# Patient Record
Sex: Male | Born: 1960 | Race: White | Hispanic: No | Marital: Single | Smoking: Former smoker
Health system: Southern US, Community
[De-identification: ages and names within clinical notes are randomized; demographics above are authoritative.]

## PROBLEM LIST (undated history)

## (undated) DIAGNOSIS — K649 Unspecified hemorrhoids: Secondary | ICD-10-CM

## (undated) DIAGNOSIS — F1910 Other psychoactive substance abuse, uncomplicated: Secondary | ICD-10-CM

## (undated) DIAGNOSIS — K4090 Unilateral inguinal hernia, without obstruction or gangrene, not specified as recurrent: Secondary | ICD-10-CM

## (undated) DIAGNOSIS — C801 Malignant (primary) neoplasm, unspecified: Secondary | ICD-10-CM

## (undated) DIAGNOSIS — D649 Anemia, unspecified: Secondary | ICD-10-CM

## (undated) DIAGNOSIS — K219 Gastro-esophageal reflux disease without esophagitis: Secondary | ICD-10-CM

## (undated) DIAGNOSIS — F1011 Alcohol abuse, in remission: Secondary | ICD-10-CM

## (undated) DIAGNOSIS — L570 Actinic keratosis: Secondary | ICD-10-CM

## (undated) DIAGNOSIS — M1990 Unspecified osteoarthritis, unspecified site: Secondary | ICD-10-CM

## (undated) HISTORY — PX: KNEE ARTHROSCOPY: SHX127

## (undated) HISTORY — DX: Actinic keratosis: L57.0

---

## 2009-02-05 ENCOUNTER — Inpatient Hospital Stay: Payer: Self-pay | Admitting: Unknown Physician Specialty

## 2010-05-16 IMAGING — CR RIGHT HAND - COMPLETE 3+ VIEW
1 series · 4 of 4 positions shown · non-contrast
Comparison: none

REASON FOR EXAM: injury/pain/swelling...pt in rm 10
COMMENTS:

[Series 1: view not recorded · 0.17mm/px · 4 of 4 slices shown]
[im 1/4]
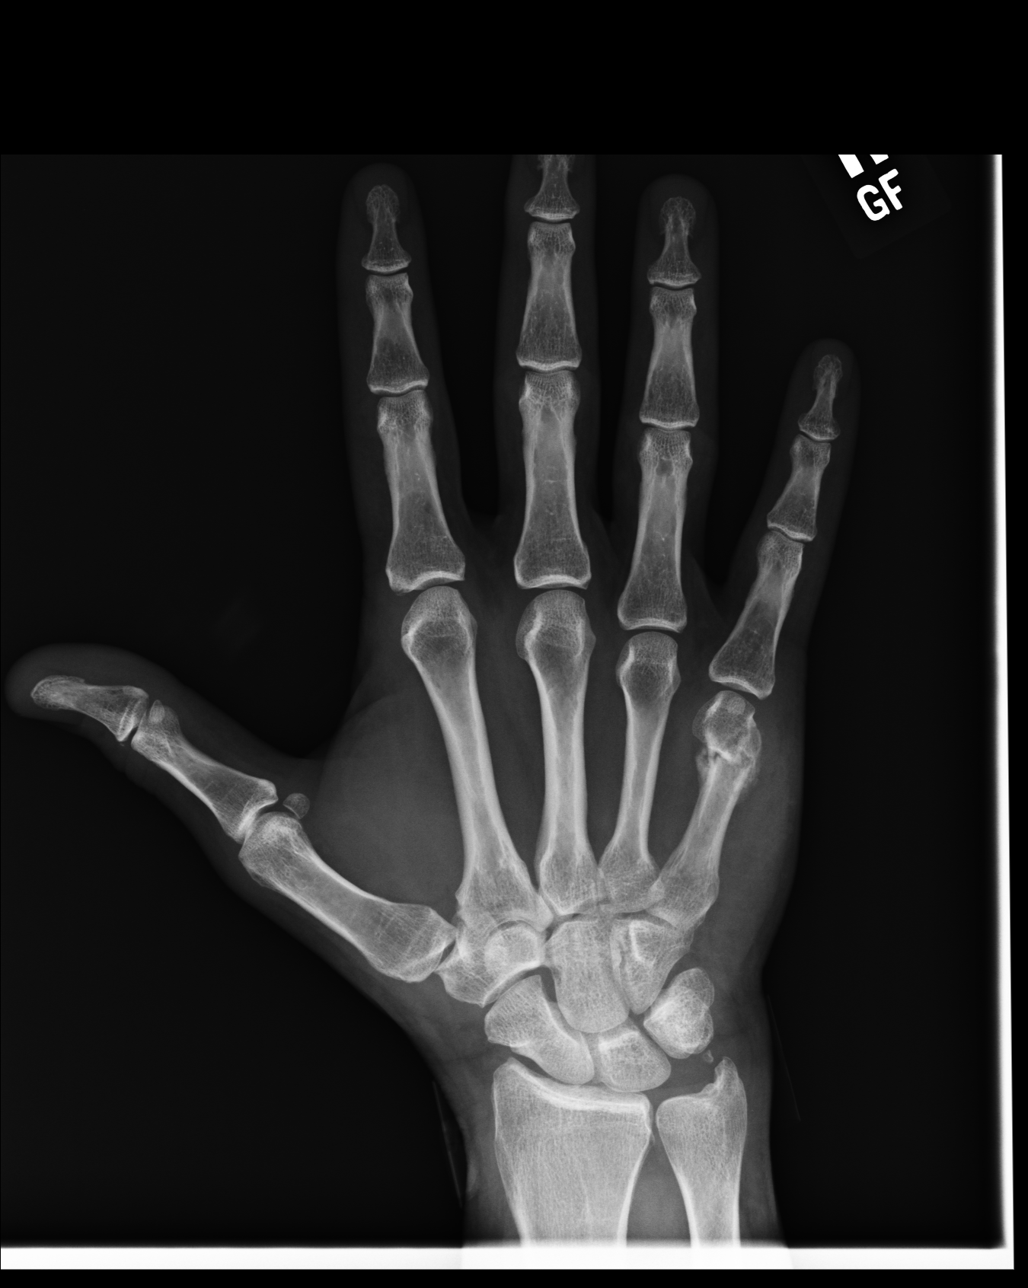
[im 2/4]
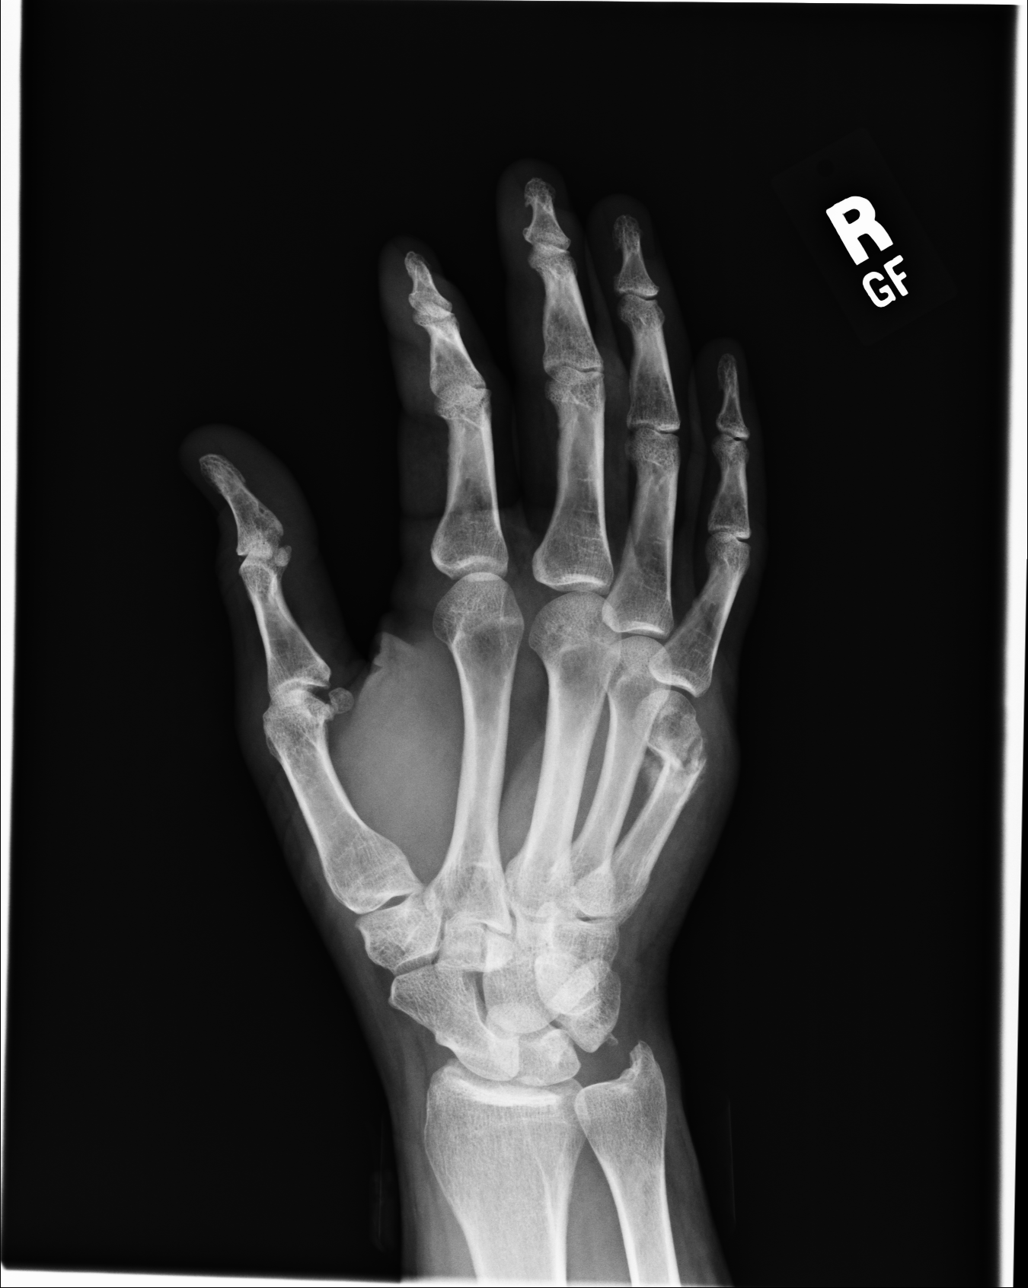
[im 3/4]
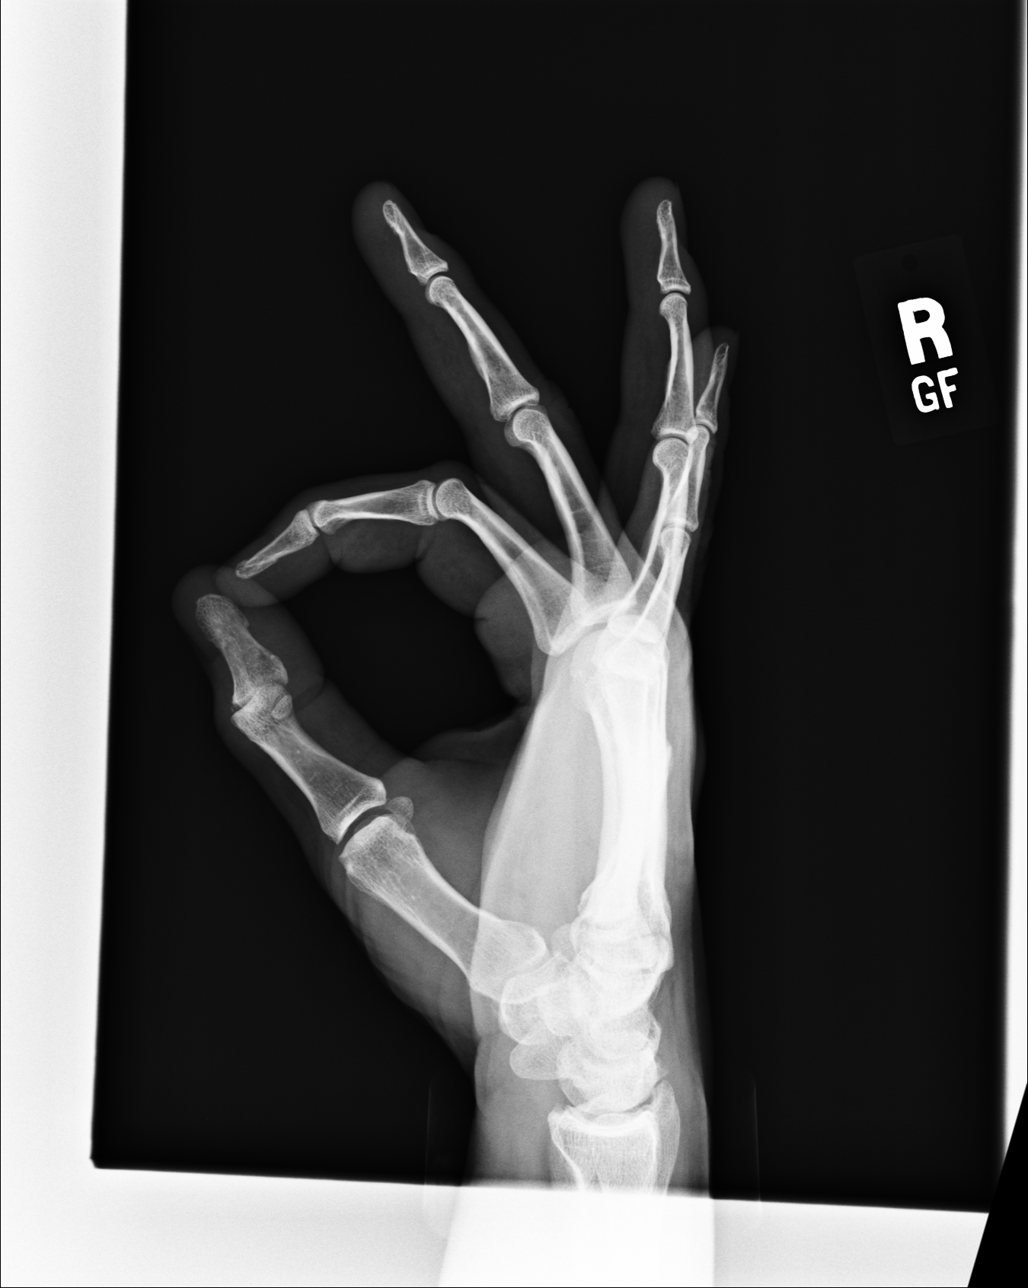
[im 4/4]
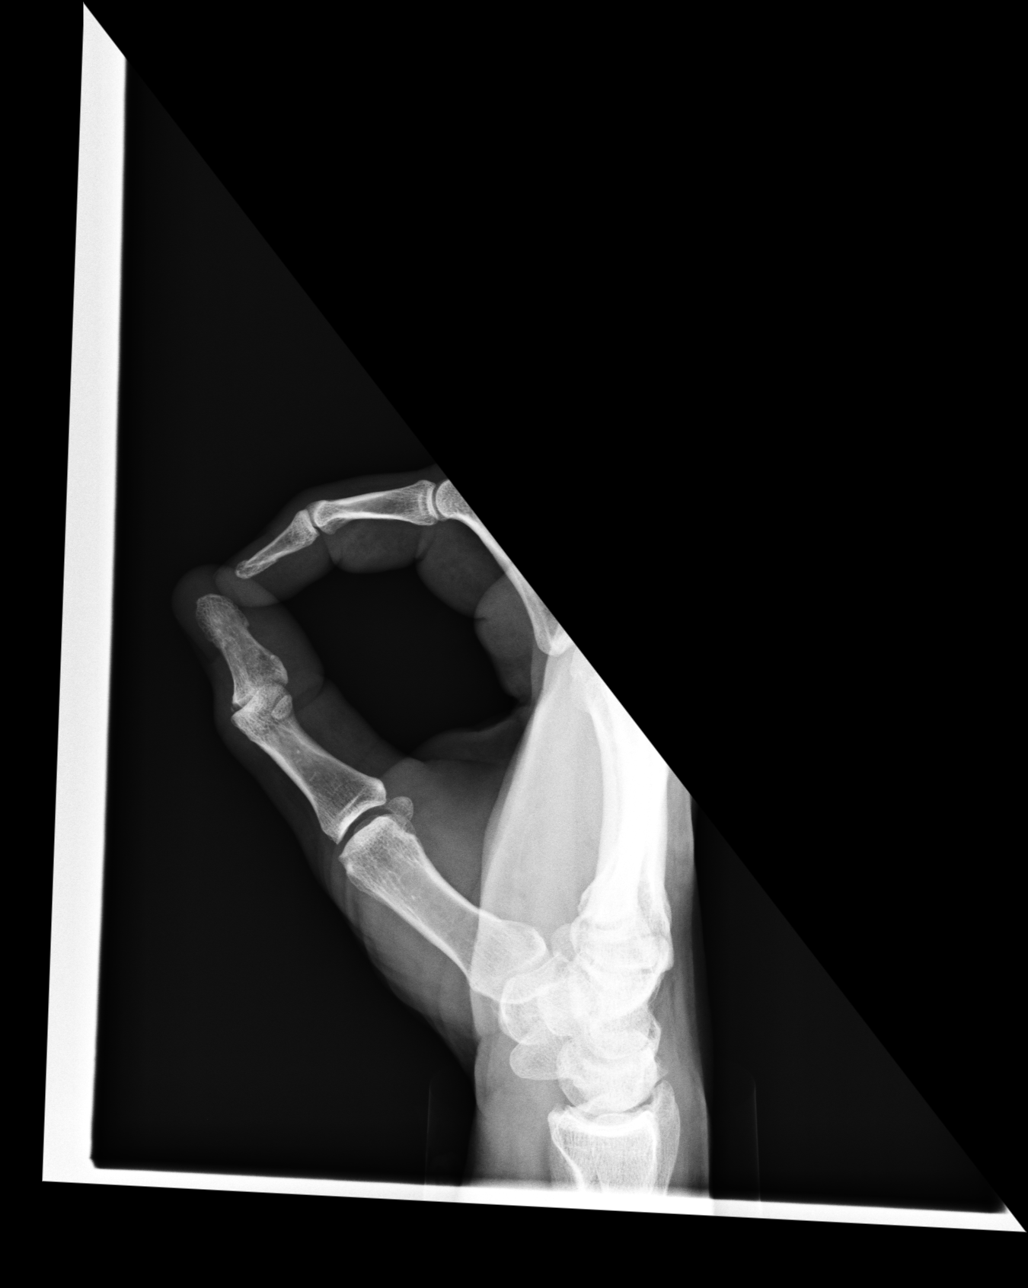

[4 of 4 positions shown; findings below may reference images not displayed]

PROCEDURE:     DXR - DXR HAND RT COMPLETE W/OBLIQUES  - February 05, 2009  [DATE]

RESULT:     There is a healing fracture of the distal right fifth
metacarpal. There is observed volar angulation of the distal fracture
component with respect to the proximal. Developing callus formation is
observed indicating that the fracture is subacute. No additional fractures
are seen. No radiodense soft tissue foreign body is identified.
IMPRESSION: 1. Fracture of the distal right fifth metacarpal as noted above.

## 2015-04-26 DIAGNOSIS — F1011 Alcohol abuse, in remission: Secondary | ICD-10-CM | POA: Insufficient documentation

## 2015-06-28 ENCOUNTER — Encounter: Payer: Self-pay | Admitting: *Deleted

## 2015-06-29 ENCOUNTER — Encounter
Admission: RE | Disposition: A | Discharge status: Home / Self Care | Payer: Self-pay | Source: Ambulatory Visit | Attending: Gastroenterology

## 2015-06-29 ENCOUNTER — Encounter: Payer: Self-pay | Admitting: Anesthesiology

## 2015-06-29 ENCOUNTER — Ambulatory Visit: Payer: No Typology Code available for payment source | Admitting: Anesthesiology

## 2015-06-29 ENCOUNTER — Ambulatory Visit
Admission: RE | Admit: 2015-06-29 | Discharge: 2015-06-29 | Disposition: A | Discharge status: Home / Self Care | Payer: No Typology Code available for payment source | Source: Ambulatory Visit | Attending: Gastroenterology | Admitting: Gastroenterology

## 2015-06-29 DIAGNOSIS — K635 Polyp of colon: Secondary | ICD-10-CM | POA: Insufficient documentation

## 2015-06-29 DIAGNOSIS — Z87891 Personal history of nicotine dependence: Secondary | ICD-10-CM | POA: Diagnosis not present

## 2015-06-29 DIAGNOSIS — Z9889 Other specified postprocedural states: Secondary | ICD-10-CM | POA: Insufficient documentation

## 2015-06-29 DIAGNOSIS — K640 First degree hemorrhoids: Secondary | ICD-10-CM | POA: Diagnosis not present

## 2015-06-29 DIAGNOSIS — Z1211 Encounter for screening for malignant neoplasm of colon: Secondary | ICD-10-CM | POA: Insufficient documentation

## 2015-06-29 DIAGNOSIS — Z8371 Family history of colonic polyps: Secondary | ICD-10-CM | POA: Diagnosis not present

## 2015-06-29 HISTORY — DX: Unilateral inguinal hernia, without obstruction or gangrene, not specified as recurrent: K40.90

## 2015-06-29 HISTORY — PX: COLONOSCOPY WITH PROPOFOL: SHX5780

## 2015-06-29 SURGERY — COLONOSCOPY WITH PROPOFOL
Anesthesia: General

## 2015-06-29 MED ORDER — SODIUM CHLORIDE 0.9 % IV SOLN
INTRAVENOUS | Status: DC
Start: 2015-06-29 — End: 2015-06-29
  Administered 2015-06-29: 1000 mL via INTRAVENOUS

## 2015-06-29 MED ORDER — LIDOCAINE HCL (CARDIAC) 20 MG/ML IV SOLN
INTRAVENOUS | Status: DC | PRN
Start: 2015-06-29 — End: 2015-06-29
  Administered 2015-06-29: 80 mg via INTRAVENOUS

## 2015-06-29 MED ORDER — SODIUM CHLORIDE 0.9 % IV SOLN: INTRAVENOUS | Status: DC

## 2015-06-29 MED ORDER — MIDAZOLAM HCL 2 MG/2ML IJ SOLN
INTRAMUSCULAR | Status: DC | PRN
  Administered 2015-06-29: 2 mg via INTRAVENOUS

## 2015-06-29 MED ORDER — PROPOFOL INFUSION 10 MG/ML OPTIME
INTRAVENOUS | Status: DC | PRN
  Administered 2015-06-29: 160 ug/kg/min via INTRAVENOUS

## 2015-06-29 NOTE — Op Note (Signed)
[facility] Gastroenterology Patient Name: Joel Ramirez Procedure Date: 06/29/2015 9:35 AM MRN: [id number] Account #: [id number] Date of Birth: 1961/05/21 Admit Type: Outpatient Age: 54 Room: [facility] ENDO ROOM 3 Gender: Male Note Status: Finalized Procedure:         Colonoscopy Indications:       Colon cancer screening in patient at increased risk:                     Family history of colon polyps, This is the patient's                     first colonoscopy Patient Profile:   This is a 54 year old male. Providers:         Gerrit Heck. Rayann Heman, MD Referring MD:      Caprice Renshaw (Referring MD) Medicines:         Propofol per Anesthesia Complications:     No immediate complications. Procedure:         Pre-Anesthesia Assessment:                    - Prior to the procedure, a History and Physical was                     performed, and patient medications, allergies and                     sensitivities were reviewed. The patient's tolerance of                     previous anesthesia was reviewed.                    After obtaining informed consent, the colonoscope was                     passed under direct vision. Throughout the procedure, the                     patient's blood pressure, pulse, and oxygen saturations                     were monitored continuously. The Colonoscope was                     introduced through the anus and advanced to the the cecum,                     identified by appendiceal orifice and ileocecal valve. The                     colonoscopy was performed without difficulty. The patient                     tolerated the procedure well. The quality of the bowel                     preparation was excellent. Findings:      The perianal and digital rectal examinations were normal.      Two sessile polyps were found in the transverse colon. The polyps were 2       to 3 mm in size. These polyps were removed with a cold snare. Resection        and retrieval were complete.      Internal  hemorrhoids were found during retroflexion. The hemorrhoids       were Grade I (internal hemorrhoids that do not prolapse).      The exam was otherwise without abnormality. Impression:        - Two 2 to 3 mm polyps in the transverse colon. Resected                     and retrieved.                    - Internal hemorrhoids.                    - The examination was otherwise normal. Recommendation:    - Observe patient in GI recovery unit.                    - High fiber diet.                    - Continue present medications.                    - Await pathology results.                    - Repeat colonoscopy for surveillance based on pathology                     results, no later than 5 years because of family history.                    - Return to referring physician.                    - The findings and recommendations were discussed with the                     patient.                    - The findings and recommendations were discussed with the                     patient's family. Procedure Code(s): --- Professional ---                    [phone], Colonoscopy, flexible; with removal of tumor(s),                     polyp(s), or other lesion(s) by snare technique CPT copyright 2014 American [employer] Association. All rights reserved. The codes documented in this report are preliminary and upon coder review may  be revised to meet current compliance requirements. Mellody Life, MD 06/29/2015 10:14:13 AM This report has been signed electronically. Number of Addenda: 0 Note Initiated On: 06/29/2015 9:35 AM Scope Withdrawal Time: 0 hours 20 minutes 53 seconds  Total Procedure Duration: 0 hours 28 minutes 27 seconds       [facility]

## 2015-06-29 NOTE — Anesthesia Preprocedure Evaluation (Addendum)
Anesthesia Evaluation  Patient identified by MRN, date of birth, ID band Patient awake    Reviewed: Allergy & Precautions, NPO status , Patient's Chart, lab work & pertinent test results, reviewed documented beta blocker date and time   Airway Mallampati: II  TM Distance: >3 FB     Dental  (+) Chipped   Pulmonary former smoker,          Cardiovascular + Orthopnea     Neuro/Psych    GI/Hepatic   Endo/Other    Renal/GU      Musculoskeletal   Abdominal   Peds  Hematology   Anesthesia Other Findings Pt has BPH.  Reproductive/Obstetrics                            Anesthesia Physical Anesthesia Plan  ASA: II  Anesthesia Plan: General   Post-op Pain Management:    Induction: Intravenous  Airway Management Planned: Nasal Cannula  Additional Equipment:   Intra-op Plan:   Post-operative Plan:   Informed Consent: I have reviewed the patients History and Physical, chart, labs and discussed the procedure including the risks, benefits and alternatives for the proposed anesthesia with the patient or authorized representative who has indicated his/her understanding and acceptance.     Plan Discussed with: CRNA  Anesthesia Plan Comments:         Anesthesia Quick Evaluation

## 2015-06-29 NOTE — Transfer of Care (Signed)
Immediate Anesthesia Transfer of Care Note  Patient: Joel Ramirez  Procedure(s) Performed: Procedure(s): COLONOSCOPY WITH PROPOFOL (N/A)  Patient Location: PACU and Endoscopy Unit  Anesthesia Type:General  Level of Consciousness: sedated  Airway & Oxygen Therapy: Patient Spontanous Breathing and Patient connected to nasal cannula oxygen  Post-op Assessment: Report given to RN and Post -op Vital signs reviewed and stable  Post vital signs: Reviewed and stable  Last Vitals: 100% sat 97.2 temp 91/60 16 resp 62 hr Filed Vitals:   06/29/15 0912  BP: 116/70  Pulse: 65  Temp: 35.7 C  Resp: 24    Complications: No apparent anesthesia complications

## 2015-06-29 NOTE — Anesthesia Postprocedure Evaluation (Signed)
  Anesthesia Post-op Note  Patient: Joel Ramirez  Procedure(s) Performed: Procedure(s): COLONOSCOPY WITH PROPOFOL (N/A)  Anesthesia type:General  Patient location: PACU  Post pain: Pain level controlled  Post assessment: Post-op Vital signs reviewed, Patient's Cardiovascular Status Stable, Respiratory Function Stable, Patent Airway and No signs of Nausea or vomiting  Post vital signs: Reviewed and stable  Last Vitals:  Filed Vitals:   06/29/15 1051  BP: 116/79  Pulse:   Temp:   Resp:     Level of consciousness: awake, alert  and patient cooperative  Complications: No apparent anesthesia complications

## 2015-06-29 NOTE — H&P (Signed)
  Primary Care Physician:  Marcello Fennel, MD  Pre-Procedure History & Physical: HPI:  Joel Ramirez is a 54 y.o. male is here for an colonoscopy.   Past Medical History  Diagnosis Date  . Inguinal hernia     Past Surgical History  Procedure Laterality Date  . Knee arthroscopy Left     Prior to Admission medications   Medication Sig Start Date End Date Taking? Authorizing Provider  OMEGA-3 FATTY ACIDS PO Take 1 capsule by mouth daily.   Yes Historical Provider, MD  Saw Palmetto, Serenoa repens, (SAW PALMETTO FRUIT PO) Take 450 mg by mouth daily.   Yes Historical Provider, MD    Allergies as of 06/08/2015  . (Not on File)    History reviewed. No pertinent family history.  History   Social History  . Marital Status: Single    Spouse Name: N/A  . Number of Children: N/A  . Years of Education: N/A   Occupational History  . Not on file.   Social History Main Topics  . Smoking status: Former Research scientist (life sciences)  . Smokeless tobacco: Not on file  . Alcohol Use: Not on file  . Drug Use: Not on file  . Sexual Activity: Not on file   Other Topics Concern  . Not on file   Social History Narrative     Physical Exam: BP 116/70 mmHg  Pulse 65  Temp(Src) 96.3 F (35.7 C) (Tympanic)  Resp 24  Ht 6\' 2"  (1.88 m)  Wt 79.379 kg (175 lb)  BMI 22.46 kg/m2  SpO2 100% General:   Alert,  pleasant and cooperative in NAD Head:  Normocephalic and atraumatic. Neck:  Supple; no masses or thyromegaly. Lungs:  Clear throughout to auscultation.    Heart:  Regular rate and rhythm. Abdomen:  Soft, nontender and nondistended. Normal bowel sounds, without guarding, and without rebound.   Neurologic:  Alert and  oriented x4;  grossly normal neurologically.  Impression/Plan: Joel Ramirez is here for an colonoscopy to be performed for screening  Risks, benefits, limitations, and alternatives regarding  colonoscopy have been reviewed with the patient.  Questions have been answered.  All  parties agreeable.   Josefine Class, MD  06/29/2015, 9:37 AM

## 2015-07-02 LAB — SURGICAL PATHOLOGY

## 2015-07-09 ENCOUNTER — Encounter: Payer: Self-pay | Admitting: Gastroenterology

## 2016-02-05 ENCOUNTER — Encounter: Payer: Self-pay | Admitting: *Deleted

## 2016-02-05 ENCOUNTER — Other Ambulatory Visit: Payer: No Typology Code available for payment source

## 2016-02-05 NOTE — Patient Instructions (Signed)
  Your procedure is scheduled on: 02-12-16  Report to [facility] To find out your arrival time please call [phone] between 1PM - 3PM on 02-11-16  Remember: Instructions that are not followed completely may result in serious medical risk, up to and including death, or upon the discretion of your surgeon and anesthesiologist your surgery may need to be rescheduled.    _X___ 1. Do not eat food or drink liquids after midnight. No gum chewing or hard candies.     _X___ 2. No Alcohol for 24 hours before or after surgery.   ____ 3. Bring all medications with you on the day of surgery if instructed.    ____ 4. Notify your doctor if there is any change in your medical condition     (cold, fever, infections).     Do not wear jewelry, make-up, hairpins, clips or nail polish.  Do not wear lotions, powders, or perfumes. You may wear deodorant.  Do not shave 48 hours prior to surgery. Men may shave face and neck.  Do not bring valuables to the hospital.    [facility] is not responsible for any belongings or valuables.               Contacts, dentures or bridgework may not be worn into surgery.  Leave your suitcase in the car. After surgery it may be brought to your room.  For patients admitted to the hospital, discharge time is determined by your  treatment team.   Patients discharged the day of surgery will not be allowed to drive home.   Please read over the following fact sheets that you were given:      ____ Take these medicines the morning of surgery with A SIP OF WATER:    1. NONE  2.   3.   4.  5.  6.  ____ Fleet Enema (as directed)   ____ Use CHG Soap as directed  ____ Use inhalers on the day of surgery  ____ Stop metformin 2 days prior to surgery    ____ Take 1/2 of usual insulin dose the night before surgery and none on the morning of surgery.   ____ Stop Coumadin/Plavix/aspirin-N/A  ____ Stop Anti-inflammatories-NO NSAIDS OR ASA  PRODUCTS-TYLENOL OK TO TAKE   _X___ Stop supplements until after surgery-PT STOPPED FISH OIL, SAW PALMETTO AND GLUCOSAMINE LAST WEEK (01-29-16)   ____ Bring C-Pap to the hospital.

## 2016-02-12 ENCOUNTER — Ambulatory Visit: Payer: BLUE CROSS/BLUE SHIELD | Admitting: Anesthesiology

## 2016-02-12 ENCOUNTER — Encounter: Payer: Self-pay | Admitting: *Deleted

## 2016-02-12 ENCOUNTER — Ambulatory Visit
Admission: RE | Admit: 2016-02-12 | Discharge: 2016-02-12 | Disposition: A | Discharge status: Home / Self Care | Payer: BLUE CROSS/BLUE SHIELD | Source: Ambulatory Visit | Attending: Surgery | Admitting: Surgery

## 2016-02-12 ENCOUNTER — Encounter
Admission: RE | Disposition: A | Discharge status: Home / Self Care | Payer: Self-pay | Source: Ambulatory Visit | Attending: Surgery

## 2016-02-12 DIAGNOSIS — Z880 Allergy status to penicillin: Secondary | ICD-10-CM | POA: Diagnosis not present

## 2016-02-12 DIAGNOSIS — Z79899 Other long term (current) drug therapy: Secondary | ICD-10-CM | POA: Insufficient documentation

## 2016-02-12 DIAGNOSIS — Z87891 Personal history of nicotine dependence: Secondary | ICD-10-CM | POA: Diagnosis not present

## 2016-02-12 DIAGNOSIS — K4020 Bilateral inguinal hernia, without obstruction or gangrene, not specified as recurrent: Secondary | ICD-10-CM | POA: Diagnosis present

## 2016-02-12 HISTORY — DX: Other psychoactive substance abuse, uncomplicated: F19.10

## 2016-02-12 HISTORY — DX: Unspecified hemorrhoids: K64.9

## 2016-02-12 HISTORY — PX: INGUINAL HERNIA REPAIR: SHX194

## 2016-02-12 LAB — URINE DRUG SCREEN, QUALITATIVE (ARMC ONLY)
AMPHETAMINES, UR SCREEN: NOT DETECTED
BENZODIAZEPINE, UR SCRN: NOT DETECTED
Barbiturates, Ur Screen: NOT DETECTED
COCAINE METABOLITE, UR ~~LOC~~: NOT DETECTED
Cannabinoid 50 Ng, Ur ~~LOC~~: NOT DETECTED
MDMA (Ecstasy)Ur Screen: NOT DETECTED
METHADONE SCREEN, URINE: NOT DETECTED
Opiate, Ur Screen: NOT DETECTED
Phencyclidine (PCP) Ur S: NOT DETECTED
Tricyclic, Ur Screen: NOT DETECTED

## 2016-02-12 SURGERY — REPAIR, HERNIA, INGUINAL, BILATERAL, ADULT
Anesthesia: General | Laterality: Bilateral | Wound class: Clean

## 2016-02-12 MED ORDER — BUPIVACAINE-EPINEPHRINE (PF) 0.5% -1:200000 IJ SOLN
INTRAMUSCULAR | Status: DC | PRN
  Administered 2016-02-12: 13 mL
  Administered 2016-02-12: 16 mL

## 2016-02-12 MED ORDER — GLYCOPYRROLATE 0.2 MG/ML IJ SOLN
INTRAMUSCULAR | Status: DC | PRN
  Administered 2016-02-12: 0.6 mg via INTRAVENOUS

## 2016-02-12 MED ORDER — FAMOTIDINE 20 MG PO TABS
ORAL_TABLET | ORAL | Status: AC
  Administered 2016-02-12: 20 mg via ORAL
  Filled 2016-02-12: qty 1

## 2016-02-12 MED ORDER — ONDANSETRON HCL 4 MG/2ML IJ SOLN: 4.0000 mg | Freq: Once | INTRAMUSCULAR | Status: DC | PRN

## 2016-02-12 MED ORDER — MIDAZOLAM HCL 2 MG/2ML IJ SOLN
INTRAMUSCULAR | Status: DC | PRN
  Administered 2016-02-12: 2 mg via INTRAVENOUS

## 2016-02-12 MED ORDER — CEFAZOLIN SODIUM-DEXTROSE 2-3 GM-% IV SOLR
INTRAVENOUS | Status: AC
  Administered 2016-02-12: 2 g via INTRAVENOUS
  Filled 2016-02-12: qty 50

## 2016-02-12 MED ORDER — HYDROCODONE-ACETAMINOPHEN 5-325 MG PO TABS
ORAL_TABLET | ORAL | Status: AC
  Filled 2016-02-12: qty 1

## 2016-02-12 MED ORDER — FENTANYL CITRATE (PF) 100 MCG/2ML IJ SOLN
INTRAMUSCULAR | Status: DC
Start: 2016-02-12 — End: 2016-02-12
  Filled 2016-02-12: qty 2

## 2016-02-12 MED ORDER — BUPIVACAINE-EPINEPHRINE (PF) 0.5% -1:200000 IJ SOLN
INTRAMUSCULAR | Status: AC
  Filled 2016-02-12: qty 30

## 2016-02-12 MED ORDER — FENTANYL CITRATE (PF) 100 MCG/2ML IJ SOLN
25.0000 ug | INTRAMUSCULAR | Status: DC | PRN
  Administered 2016-02-12 (×4): 25 ug via INTRAVENOUS

## 2016-02-12 MED ORDER — SUCCINYLCHOLINE CHLORIDE 20 MG/ML IJ SOLN
INTRAMUSCULAR | Status: DC | PRN
  Administered 2016-02-12: 100 mg via INTRAVENOUS

## 2016-02-12 MED ORDER — ROCURONIUM BROMIDE 100 MG/10ML IV SOLN
INTRAVENOUS | Status: DC | PRN
  Administered 2016-02-12: 10 mg via INTRAVENOUS
  Administered 2016-02-12: 50 mg via INTRAVENOUS
  Administered 2016-02-12: 10 mg via INTRAVENOUS

## 2016-02-12 MED ORDER — HYDROMORPHONE HCL 1 MG/ML IJ SOLN
INTRAMUSCULAR | Status: AC
  Filled 2016-02-12: qty 1

## 2016-02-12 MED ORDER — CEFAZOLIN SODIUM-DEXTROSE 2-3 GM-% IV SOLR
2.0000 g | Freq: Once | INTRAVENOUS | Status: AC
  Administered 2016-02-12: 2 g via INTRAVENOUS

## 2016-02-12 MED ORDER — NEOSTIGMINE METHYLSULFATE 10 MG/10ML IV SOLN
INTRAVENOUS | Status: DC | PRN
  Administered 2016-02-12: 3.5 mg via INTRAVENOUS

## 2016-02-12 MED ORDER — HYDROCODONE-ACETAMINOPHEN 5-325 MG PO TABS: 1.0000 | ORAL_TABLET | ORAL | Status: DC | PRN

## 2016-02-12 MED ORDER — ONDANSETRON HCL 4 MG/2ML IJ SOLN
INTRAMUSCULAR | Status: DC | PRN
  Administered 2016-02-12: 4 mg via INTRAVENOUS

## 2016-02-12 MED ORDER — PHENYLEPHRINE HCL 10 MG/ML IJ SOLN
INTRAMUSCULAR | Status: DC | PRN
  Administered 2016-02-12 (×4): 100 ug via INTRAVENOUS
  Administered 2016-02-12: 200 ug via INTRAVENOUS
  Administered 2016-02-12 (×2): 100 ug via INTRAVENOUS
  Administered 2016-02-12: 200 ug via INTRAVENOUS

## 2016-02-12 MED ORDER — HYDROMORPHONE HCL 1 MG/ML IJ SOLN
0.2500 mg | INTRAMUSCULAR | Status: DC | PRN
  Administered 2016-02-12 (×4): 0.25 mg via INTRAVENOUS

## 2016-02-12 MED ORDER — DEXAMETHASONE SODIUM PHOSPHATE 10 MG/ML IJ SOLN
INTRAMUSCULAR | Status: DC | PRN
  Administered 2016-02-12: 10 mg via INTRAVENOUS

## 2016-02-12 MED ORDER — HYDROCODONE-ACETAMINOPHEN 5-325 MG PO TABS
1.0000 | ORAL_TABLET | ORAL | Status: DC | PRN
  Administered 2016-02-12: 1 via ORAL

## 2016-02-12 MED ORDER — PROPOFOL 10 MG/ML IV BOLUS
INTRAVENOUS | Status: DC | PRN
  Administered 2016-02-12: 170 mg via INTRAVENOUS

## 2016-02-12 MED ORDER — FAMOTIDINE 20 MG PO TABS
20.0000 mg | ORAL_TABLET | Freq: Once | ORAL | Status: AC
  Administered 2016-02-12: 20 mg via ORAL

## 2016-02-12 MED ORDER — LACTATED RINGERS IV SOLN
INTRAVENOUS | Status: DC
  Administered 2016-02-12 (×2): via INTRAVENOUS

## 2016-02-12 MED ORDER — FENTANYL CITRATE (PF) 100 MCG/2ML IJ SOLN
INTRAMUSCULAR | Status: DC | PRN
  Administered 2016-02-12: 50 ug via INTRAVENOUS
  Administered 2016-02-12: 100 ug via INTRAVENOUS
  Administered 2016-02-12: 50 ug via INTRAVENOUS

## 2016-02-12 SURGICAL SUPPLY — 27 items
BLADE SURG 15 STRL LF DISP TIS (BLADE) ×1 IMPLANT
BLADE SURG 15 STRL SS (BLADE) ×2
CANISTER SUCT 1200ML W/VALVE (MISCELLANEOUS) ×3 IMPLANT
CHLORAPREP W/TINT 26ML (MISCELLANEOUS) ×3 IMPLANT
DERMABOND ADVANCED (GAUZE/BANDAGES/DRESSINGS) ×2
DERMABOND ADVANCED .7 DNX12 (GAUZE/BANDAGES/DRESSINGS) ×1 IMPLANT
DRAIN PENROSE 5/8X18 LTX STRL (WOUND CARE) ×3 IMPLANT
DRAPE LAPAROTOMY 77X122 PED (DRAPES) ×3 IMPLANT
ELECT REM PT RETURN 9FT ADLT (ELECTROSURGICAL) ×3
ELECTRODE REM PT RTRN 9FT ADLT (ELECTROSURGICAL) ×1 IMPLANT
GLOVE BIO SURGEON STRL SZ7.5 (GLOVE) ×9 IMPLANT
GOWN STRL REUS W/ TWL LRG LVL3 (GOWN DISPOSABLE) ×4 IMPLANT
GOWN STRL REUS W/TWL LRG LVL3 (GOWN DISPOSABLE) ×8
KIT RM TURNOVER STRD PROC AR (KITS) ×3 IMPLANT
LABEL OR SOLS (LABEL) ×3 IMPLANT
LIQUID BAND (GAUZE/BANDAGES/DRESSINGS) ×3 IMPLANT
MESH SYNTHETIC 4X6 SOFT BARD (Mesh General) ×1 IMPLANT
MESH SYNTHETIC SOFT BARD 4X6 (Mesh General) ×2 IMPLANT
NEEDLE HYPO 25X1 1.5 SAFETY (NEEDLE) ×3 IMPLANT
NS IRRIG 500ML POUR BTL (IV SOLUTION) ×3 IMPLANT
PACK BASIN MINOR ARMC (MISCELLANEOUS) ×3 IMPLANT
SUT CHROMIC 4 0 RB 1X27 (SUTURE) ×3 IMPLANT
SUT MNCRL AB 4-0 PS2 18 (SUTURE) ×3 IMPLANT
SUT SURGILON 0 30 BLK (SUTURE) ×12 IMPLANT
SUT VIC AB 4-0 SH 27 (SUTURE) ×4
SUT VIC AB 4-0 SH 27XANBCTRL (SUTURE) ×2 IMPLANT
SYRINGE 10CC LL (SYRINGE) ×3 IMPLANT

## 2016-02-12 NOTE — Anesthesia Preprocedure Evaluation (Signed)
Anesthesia Evaluation  Patient identified by MRN, date of birth, ID band Patient awake    Reviewed: Allergy & Precautions, H&P , NPO status , Patient's Chart, lab work & pertinent test results, reviewed documented beta blocker date and time   Airway Mallampati: II  TM Distance: >3 FB Neck ROM: full    Dental  (+) Teeth Intact   Pulmonary neg pulmonary ROS, former smoker,    Pulmonary exam normal        Cardiovascular negative cardio ROS Normal cardiovascular exam Rhythm:regular Rate:Normal     Neuro/Psych negative neurological ROS  negative psych ROS   GI/Hepatic negative GI ROS, Neg liver ROS,   Endo/Other  negative endocrine ROS  Renal/GU negative Renal ROS  negative genitourinary   Musculoskeletal   Abdominal   Peds  Hematology negative hematology ROS (+)   Anesthesia Other Findings Past Medical History:   Inguinal hernia                                              Substance abuse                                              Hemorrhoids                                                Past Surgical History:   KNEE ARTHROSCOPY                                Left              COLONOSCOPY WITH PROPOFOL                       N/A 06/29/2015      Comment:Procedure: COLONOSCOPY WITH PROPOFOL;  Surgeon:              Josefine Class, MD;  Location: [facility]               ENDOSCOPY;  Service: Endoscopy;  Laterality:               N/A; BMI    Body Mass Index   23.10 kg/m 2     Reproductive/Obstetrics negative OB ROS                             Anesthesia Physical Anesthesia Plan  ASA: II  Anesthesia Plan: General ETT   Post-op Pain Management:    Induction:   Airway Management Planned:   Additional Equipment:   Intra-op Plan:   Post-operative Plan:   Informed Consent: I have reviewed the patients History and Physical, chart, labs and discussed the procedure including the  risks, benefits and alternatives for the proposed anesthesia with the patient or authorized representative who has indicated his/her understanding and acceptance.   Dental Advisory Given  Plan Discussed with: CRNA  Anesthesia Plan Comments:         Anesthesia Quick Evaluation

## 2016-02-12 NOTE — Op Note (Signed)
OPERATIVE REPORT  PREOPERATIVE DIAGNOSIS: bilateral inguinal hernia  POSTOPERATIVE DIAGNOSIS:bilateral  inguinal hernia  PROCEDURE:  bilateral inguinal hernia repair  ANESTHESIA:  General  SURGEON:  Rochel Brome M.D.  INDICATIONS: He had noted bulging in the left groin. When he and physical exam he had findings of bilateral inguinal hernia repairs. Surgery was recommended for definitive treatment.  With the patient on the operating table in the supine position the bilateral lower quadrants were prepared with clippers and with ChloraPrep and draped in a sterile manner. A transversely oriented right suprapubic incision was made and carried down through subcutaneous tissues. Electrocautery was used for hemostasis. The Scarpa's fascia was incised. The external oblique aponeurosis was incised along the course of its fibers to open the external ring and expose the inguinal cord structures. The ilioinguinal nerve was seen coursing on the lateral aspect of the cord structures. The cord structures were mobilized. A Penrose drain was passed around the cord structures for traction. Cremaster fibers were separated to expose an indirect hernia sac. The sac was dissected free from surrounding structures. The sac was approximately 5 cm in length and was followed up into the internal ring. The sac was opened. Its continuity with the peritoneal cavity was demonstrated. A high ligation of the sac was done with a 0 Surgilon suture ligature. The sac was excised. The stump was allowed to retract. There was also a direct inguinal hernia defect the smaller sac was dissected free from the fascia and was inverted. The repair was carried out with 0 Surgilon sutures suturing the conjoined tendon to the Cooper's ligament into the shelving edge of the inguinal ligament incorporating transversalis fascia into the repair. The last stitch led to satisfactory narrowing of the internal ring  Bard soft mesh was cut to create an oval  shape and was placed over the repair. This was sutured to the repair with interrupted 0 Surgilon sutures and also sutured medially to the deep fascia and on both sides of the internal ring. Next after seeing hemostasis was intact the cord structures were replaced along the floor of the inguinal canal. The cut edges of the external oblique aponeurosis were closed with a running 4-0 Vicryl suture to re-create the external ring. The deep fascia superior and lateral to the repair site was infiltrated with half percent Sensorcaine with epinephrine. Subcutaneous tissues were also infiltrated. The Scarpa's fascia was closed with interrupted 4-0 Vicryl sutures. The skin was closed with running 4-0 Monocryl subcuticular suture.  Next a transversely oriented left suprapubic incision was made and carried down through subcutaneous tissues. Electrocautery was used for hemostasis. The Scarpa's fascia was incised. The external oblique aponeurosis was incised along the course of its fibers to open the external ring and expose the inguinal cord structures. The cord structures were mobilized. A Penrose drain was passed around the cord structures for traction. Cremaster fibers were separated to expose an indirect hernia sac which was some 4 cm in length. The sac was opened. Its continuity with the peritoneal cavity was demonstrated. A high ligation of the sac was done with a 0 Surgilon suture ligature. The sac was excised and the stump was allowed to retract. The floor of the inguinal canal was repaired with 0 Surgilon sutures suturing the conjoined tendon to the shelving edge of the inguinal ligament incorporating transversalis fascia into the repair. The last stitch led to satisfactory narrowing of the internal ring. Bard soft mesh was cut to create an oval shape and was placed over the  repair. This was sutured to the repair with interrupted 0 Surgilon sutures and also sutured medially to the deep fascia and on both sides of the  internal ring. Next after seeing hemostasis was intact the cord structures were replaced along the floor of the inguinal canal. The cut edges of the external oblique aponeurosis were closed with a running 4-0 Vicryl suture to re-create the external ring. The deep fascia superior and lateral to the repair site was infiltrated with half percent Sensorcaine with epinephrine. Subcutaneous tissues were also infiltrated. The Scarpa's fascia was closed with interrupted 4-0 Vicryl sutures. The skin was closed with running 4-0 Monocryl subcuticular suture .  Both wounds were treated with LiquiBand.. The testicles remained in the scrotum  The patient appeared to be in satisfactory condition and was prepared for transfer to the recovery room.  Rochel Brome M.D.

## 2016-02-12 NOTE — Transfer of Care (Signed)
Immediate Anesthesia Transfer of Care Note  Patient: Joel Ramirez  Procedure(s) Performed: Procedure(s): HERNIA REPAIR INGUINAL ADULT BILATERAL (Bilateral)  Patient Location: PACU  Anesthesia Type:General  Level of Consciousness: sedated  Airway & Oxygen Therapy: Patient Spontanous Breathing  Post-op Assessment: Report given to RN and Post -op Vital signs reviewed and stable  Post vital signs: Reviewed and stable  Last Vitals:  Filed Vitals:   02/12/16 1058 02/12/16 1402  BP: 123/86 118/73  Pulse: 62 67  Temp: 36.8 C 36.1 C  Resp: 16 12    Complications: No apparent anesthesia complications

## 2016-02-12 NOTE — H&P (Signed)
  He reports that since the office visit he has had some soreness in the left side of his scrotum just above his testicle. He reports no other change in overall condition.  On examination there is a small area of swelling above his testicle consistent with small hydrocele. There is minimal degree of tenderness.  I discussed the plan for bilateral inguinal hernia repair.

## 2016-02-12 NOTE — Anesthesia Procedure Notes (Signed)
Procedure Name: Intubation Date/Time: 02/12/2016 11:58 AM Performed by: Nelda Marseille Pre-anesthesia Checklist: Patient identified, Patient being monitored, Timeout performed, Emergency Drugs available and Suction available Patient Re-evaluated:Patient Re-evaluated prior to inductionOxygen Delivery Method: Circle system utilized Preoxygenation: Pre-oxygenation with 100% oxygen Intubation Type: IV induction Ventilation: Mask ventilation without difficulty Laryngoscope Size: Mac and 3 Grade View: Grade I Tube type: Oral Tube size: 7.0 mm Number of attempts: 1 Airway Equipment and Method: Stylet Placement Confirmation: ETT inserted through vocal cords under direct vision,  positive ETCO2 and breath sounds checked- equal and bilateral Secured at: 21 cm Tube secured with: Tape Dental Injury: Teeth and Oropharynx as per pre-operative assessment

## 2016-02-12 NOTE — Discharge Instructions (Addendum)
Take Tylenol or Norco if needed for pain.May shower. Avoid straining and heavy lifting   AMBULATORY SURGERY  DISCHARGE INSTRUCTIONS   1) The drugs that you were given will stay in your system until tomorrow so for the next 24 hours you should not:  A) Drive an automobile B) Make any legal decisions C) Drink any alcoholic beverage   2) You may resume regular meals tomorrow.  Today it is better to start with liquids and gradually work up to solid foods.  You may eat anything you prefer, but it is better to start with liquids, then soup and crackers, and gradually work up to solid foods.   3) Please notify your doctor immediately if you have any unusual bleeding, trouble breathing, redness and pain at the surgery site, drainage, fever, or pain not relieved by medication.    4) Additional Instructions:        Please contact your physician with any problems or Same Day Surgery at [phone], Monday through Friday 6 am to 4 pm, or [place] [facility] [place] [facility] number at [phone]. 

## 2016-02-13 ENCOUNTER — Encounter: Payer: Self-pay | Admitting: Surgery

## 2016-02-13 NOTE — Anesthesia Postprocedure Evaluation (Signed)
Anesthesia Post Note  Patient: Joel Ramirez  Procedure(s) Performed: Procedure(s) (LRB): HERNIA REPAIR INGUINAL ADULT BILATERAL (Bilateral)  Patient location during evaluation: PACU Anesthesia Type: General Level of consciousness: awake and alert Pain management: pain level controlled Vital Signs Assessment: post-procedure vital signs reviewed and stable Respiratory status: spontaneous breathing, nonlabored ventilation, respiratory function stable and patient connected to nasal cannula oxygen Cardiovascular status: blood pressure returned to baseline and stable Postop Assessment: no signs of nausea or vomiting Anesthetic complications: no    Last Vitals:  Filed Vitals:   02/12/16 1544 02/12/16 1614  BP: 136/79 134/80  Pulse: 81 88  Temp:    Resp: 14 16    Last Pain:  Filed Vitals:   02/12/16 1615  PainSc: Iberia Adams

## 2016-05-15 ENCOUNTER — Encounter: Payer: Self-pay | Admitting: Surgery

## 2017-10-02 ENCOUNTER — Encounter
Admission: RE | Admit: 2017-10-02 | Discharge: 2017-10-02 | Disposition: A | Discharge status: Home / Self Care | Payer: BLUE CROSS/BLUE SHIELD | Source: Ambulatory Visit | Attending: Surgery | Admitting: Surgery

## 2017-10-02 HISTORY — DX: Gastro-esophageal reflux disease without esophagitis: K21.9

## 2017-10-02 NOTE — Patient Instructions (Signed)
Your procedure is scheduled on: 10-08-17  Report to Same Day Surgery 2nd floor medical mall [facility] Entrance-take elevator on left to 2nd floor.  Check in with surgery information desk.) To find out your arrival time please call [phone] between 1PM - 3PM on 10-07-17  Remember: Instructions that are not followed completely may result in serious medical risk, up to and including death, or upon the discretion of your surgeon and anesthesiologist your surgery may need to be rescheduled.    _x___ 1. Do not eat food after midnight the night before your procedure. You may drink clear liquids up to 2 hours before you are scheduled to arrive at the hospital for your procedure.  Do not drink clear liquids within 2 hours of your scheduled arrival to the hospital.  Clear liquids include  --Water or Apple juice without pulp  --Clear carbohydrate beverage such as ClearFast or Gatorade  --Black Coffee or Clear Tea (No milk, no creamers, do not add anything to the coffee or Tea Type 1 and type 2 diabetics should only drink water.  No gum chewing or hard candies-(THIS ALSO INCLUDES NICORETTE GUM)    __x__ 2. No Alcohol for 24 hours before or after surgery.   __x__3. No Smoking for 24 prior to surgery.   ____  4. Bring all medications with you on the day of surgery if instructed.    __x__ 5. Notify your doctor if there is any change in your medical condition     (cold, fever, infections).     Do not wear jewelry, make-up, hairpins, clips or nail polish.  Do not wear lotions, powders, or perfumes. You may wear deodorant.  Do not shave 48 hours prior to surgery. Men may shave face and neck.  Do not bring valuables to the hospital.    [facility] is not responsible for any belongings or valuables.               Contacts, dentures or bridgework may not be worn into surgery.  Leave your suitcase in the car. After surgery it may be brought to your room.  For patients admitted to the hospital,  discharge time is determined by your  treatment team.   Patients discharged the day of surgery will not be allowed to drive home.  You will need someone to drive you home and stay with you the night of your procedure.    Please read over the following fact sheets that you were given:   [facility] Preparing for Surgery and or MRSA Information   ____ Take anti-hypertensive listed below, cardiac, seizure, asthma, anti-reflux and psychiatric medicines. These include:  1. NONE  2.  3.  4.  5.  6.  ____Fleets enema or Magnesium Citrate as directed.   _x___ Use CHG Soap or sage wipes as directed on instruction sheet   ____ Use inhalers on the day of surgery and bring to hospital day of surgery  ____ Stop Metformin and Janumet 2 days prior to surgery.    ____ Take 1/2 of usual insulin dose the night before surgery and none on the morning surgery.   _X___ Follow recommendations from Cardiologist, Pulmonologist or PCP regarding stopping Aspirin, Coumadin, Plavix ,Eliquis, Effient, or Pradaxa, and Pletal-STOP ASA NOW  X____Stop Anti-inflammatories such as Advil, Aleve, Ibuprofen, Motrin, Naproxen, Naprosyn, Goodies powders or aspirin products AND ALKA-SELTZER NOW. OK to take Tylenol    _x___ Stop supplements until after surgery-STOP FISH OIL, SAW PALMETTO, AND CRANBERRY NOW  ____ Bring C-Pap to the hospital.

## 2017-10-08 ENCOUNTER — Encounter
Admission: RE | Disposition: A | Discharge status: Home / Self Care | Payer: Self-pay | Source: Ambulatory Visit | Attending: Surgery

## 2017-10-08 ENCOUNTER — Encounter: Payer: Self-pay | Admitting: *Deleted

## 2017-10-08 ENCOUNTER — Ambulatory Visit: Payer: BLUE CROSS/BLUE SHIELD | Admitting: Anesthesiology

## 2017-10-08 ENCOUNTER — Ambulatory Visit
Admission: RE | Admit: 2017-10-08 | Discharge: 2017-10-08 | Disposition: A | Discharge status: Home / Self Care | Payer: BLUE CROSS/BLUE SHIELD | Source: Ambulatory Visit | Attending: Surgery | Admitting: Surgery

## 2017-10-08 DIAGNOSIS — Z880 Allergy status to penicillin: Secondary | ICD-10-CM | POA: Insufficient documentation

## 2017-10-08 DIAGNOSIS — K219 Gastro-esophageal reflux disease without esophagitis: Secondary | ICD-10-CM | POA: Diagnosis not present

## 2017-10-08 DIAGNOSIS — K4091 Unilateral inguinal hernia, without obstruction or gangrene, recurrent: Secondary | ICD-10-CM | POA: Insufficient documentation

## 2017-10-08 DIAGNOSIS — Z87891 Personal history of nicotine dependence: Secondary | ICD-10-CM | POA: Insufficient documentation

## 2017-10-08 HISTORY — PX: INGUINAL HERNIA REPAIR: SHX194

## 2017-10-08 LAB — URINE DRUG SCREEN, QUALITATIVE (ARMC ONLY)
AMPHETAMINES, UR SCREEN: NOT DETECTED
BENZODIAZEPINE, UR SCRN: NOT DETECTED
Barbiturates, Ur Screen: NOT DETECTED
Cannabinoid 50 Ng, Ur ~~LOC~~: NOT DETECTED
Cocaine Metabolite,Ur ~~LOC~~: NOT DETECTED
MDMA (Ecstasy)Ur Screen: NOT DETECTED
METHADONE SCREEN, URINE: NOT DETECTED
OPIATE, UR SCREEN: NOT DETECTED
Phencyclidine (PCP) Ur S: NOT DETECTED
TRICYCLIC, UR SCREEN: NOT DETECTED

## 2017-10-08 SURGERY — REPAIR, HERNIA, INGUINAL, LAPAROSCOPIC
Anesthesia: General | Laterality: Left | Wound class: Clean

## 2017-10-08 MED ORDER — VANCOMYCIN HCL IN DEXTROSE 1-5 GM/200ML-% IV SOLN
INTRAVENOUS | Status: AC
  Filled 2017-10-08: qty 200

## 2017-10-08 MED ORDER — SUGAMMADEX SODIUM 200 MG/2ML IV SOLN
INTRAVENOUS | Status: DC | PRN
  Administered 2017-10-08: 300 mg via INTRAVENOUS

## 2017-10-08 MED ORDER — FAMOTIDINE 20 MG PO TABS
20.0000 mg | ORAL_TABLET | Freq: Once | ORAL | Status: AC
  Administered 2017-10-08: 20 mg via ORAL

## 2017-10-08 MED ORDER — FENTANYL CITRATE (PF) 100 MCG/2ML IJ SOLN
INTRAMUSCULAR | Status: AC
  Filled 2017-10-08: qty 2

## 2017-10-08 MED ORDER — EPHEDRINE SULFATE 50 MG/ML IJ SOLN
INTRAMUSCULAR | Status: DC | PRN
  Administered 2017-10-08: 10 mg via INTRAVENOUS

## 2017-10-08 MED ORDER — VANCOMYCIN HCL IN DEXTROSE 1-5 GM/200ML-% IV SOLN
1000.0000 mg | Freq: Once | INTRAVENOUS | Status: AC
  Administered 2017-10-08: 1000 mg via INTRAVENOUS

## 2017-10-08 MED ORDER — FENTANYL CITRATE (PF) 100 MCG/2ML IJ SOLN
INTRAMUSCULAR | Status: DC | PRN
Start: 2017-10-08 — End: 2017-10-08
  Administered 2017-10-08: 25 ug via INTRAVENOUS
  Administered 2017-10-08: 50 ug via INTRAVENOUS
  Administered 2017-10-08 (×2): 25 ug via INTRAVENOUS

## 2017-10-08 MED ORDER — ONDANSETRON HCL 4 MG/2ML IJ SOLN
INTRAMUSCULAR | Status: AC
  Filled 2017-10-08: qty 2

## 2017-10-08 MED ORDER — DEXAMETHASONE SODIUM PHOSPHATE 10 MG/ML IJ SOLN
INTRAMUSCULAR | Status: AC
  Filled 2017-10-08: qty 1

## 2017-10-08 MED ORDER — MIDAZOLAM HCL 2 MG/2ML IJ SOLN
INTRAMUSCULAR | Status: DC | PRN
  Administered 2017-10-08: 2 mg via INTRAVENOUS

## 2017-10-08 MED ORDER — PROPOFOL 10 MG/ML IV BOLUS
INTRAVENOUS | Status: DC | PRN
  Administered 2017-10-08: 180 mg via INTRAVENOUS

## 2017-10-08 MED ORDER — OXYCODONE HCL 5 MG PO TABS
5.0000 mg | ORAL_TABLET | Freq: Once | ORAL | Status: AC
  Administered 2017-10-08: 5 mg via ORAL

## 2017-10-08 MED ORDER — FENTANYL CITRATE (PF) 100 MCG/2ML IJ SOLN
50.0000 ug | Freq: Once | INTRAMUSCULAR | Status: AC
  Administered 2017-10-08: 50 ug via INTRAVENOUS

## 2017-10-08 MED ORDER — FAMOTIDINE 20 MG PO TABS
ORAL_TABLET | ORAL | Status: AC
  Filled 2017-10-08: qty 1

## 2017-10-08 MED ORDER — ROCURONIUM BROMIDE 50 MG/5ML IV SOLN
INTRAVENOUS | Status: AC
  Filled 2017-10-08: qty 1

## 2017-10-08 MED ORDER — FENTANYL CITRATE (PF) 100 MCG/2ML IJ SOLN
INTRAMUSCULAR | Status: AC
Start: 2017-10-08 — End: 2017-10-08
  Administered 2017-10-08: 50 ug via INTRAVENOUS
  Filled 2017-10-08: qty 2

## 2017-10-08 MED ORDER — LIDOCAINE HCL (CARDIAC) 20 MG/ML IV SOLN
INTRAVENOUS | Status: DC | PRN
  Administered 2017-10-08: 40 mg via INTRAVENOUS

## 2017-10-08 MED ORDER — PROPOFOL 10 MG/ML IV BOLUS
INTRAVENOUS | Status: AC
  Filled 2017-10-08: qty 20

## 2017-10-08 MED ORDER — PROMETHAZINE HCL 25 MG/ML IJ SOLN: 6.2500 mg | INTRAMUSCULAR | Status: DC | PRN

## 2017-10-08 MED ORDER — LACTATED RINGERS IV SOLN
INTRAVENOUS | Status: DC
  Administered 2017-10-08 (×2): via INTRAVENOUS

## 2017-10-08 MED ORDER — FENTANYL CITRATE (PF) 100 MCG/2ML IJ SOLN
INTRAMUSCULAR | Status: AC
  Administered 2017-10-08: 50 ug via INTRAVENOUS
  Filled 2017-10-08: qty 2

## 2017-10-08 MED ORDER — HYDROCODONE-ACETAMINOPHEN 5-325 MG PO TABS: 1.0000 | ORAL_TABLET | ORAL | Status: DC | PRN

## 2017-10-08 MED ORDER — SEVOFLURANE IN SOLN
RESPIRATORY_TRACT | Status: AC
  Filled 2017-10-08: qty 250

## 2017-10-08 MED ORDER — HYDROCODONE-ACETAMINOPHEN 5-325 MG PO TABS: 1.0000 | ORAL_TABLET | Freq: Four times a day (QID) | ORAL | 0 refills | Status: DC | PRN

## 2017-10-08 MED ORDER — MIDAZOLAM HCL 2 MG/2ML IJ SOLN
INTRAMUSCULAR | Status: AC
  Filled 2017-10-08: qty 2

## 2017-10-08 MED ORDER — FENTANYL CITRATE (PF) 100 MCG/2ML IJ SOLN
25.0000 ug | INTRAMUSCULAR | Status: DC | PRN
  Administered 2017-10-08 (×3): 50 ug via INTRAVENOUS

## 2017-10-08 MED ORDER — ROCURONIUM BROMIDE 100 MG/10ML IV SOLN
INTRAVENOUS | Status: DC | PRN
  Administered 2017-10-08: 10 mg via INTRAVENOUS
  Administered 2017-10-08: 5 mg via INTRAVENOUS
  Administered 2017-10-08: 45 mg via INTRAVENOUS

## 2017-10-08 MED ORDER — LIDOCAINE HCL (PF) 2 % IJ SOLN
INTRAMUSCULAR | Status: AC
  Filled 2017-10-08: qty 10

## 2017-10-08 MED ORDER — OXYCODONE HCL 5 MG PO TABS
ORAL_TABLET | ORAL | Status: AC
  Filled 2017-10-08: qty 1

## 2017-10-08 MED ORDER — ONDANSETRON HCL 4 MG/2ML IJ SOLN
INTRAMUSCULAR | Status: DC | PRN
  Administered 2017-10-08: 4 mg via INTRAVENOUS

## 2017-10-08 SURGICAL SUPPLY — 26 items
CANISTER SUCT 1200ML W/VALVE (MISCELLANEOUS) ×3 IMPLANT
CHLORAPREP W/TINT 26ML (MISCELLANEOUS) ×3 IMPLANT
CLOSURE WOUND 1/2 X4 (GAUZE/BANDAGES/DRESSINGS)
ELECT REM PT RETURN 9FT ADLT (ELECTROSURGICAL) ×3
ELECTRODE REM PT RTRN 9FT ADLT (ELECTROSURGICAL) ×1 IMPLANT
GAUZE SPONGE 4X4 12PLY STRL (GAUZE/BANDAGES/DRESSINGS) IMPLANT
GLOVE BIO SURGEON STRL SZ7.5 (GLOVE) ×15 IMPLANT
GOWN STRL REUS W/ TWL LRG LVL3 (GOWN DISPOSABLE) ×3 IMPLANT
GOWN STRL REUS W/TWL LRG LVL3 (GOWN DISPOSABLE) ×6
IV NS 1000ML (IV SOLUTION) ×2
IV NS 1000ML BAXH (IV SOLUTION) ×1 IMPLANT
KIT RM TURNOVER STRD PROC AR (KITS) ×3 IMPLANT
LABEL OR SOLS (LABEL) ×3 IMPLANT
MESH HERNIA 3X6 (Mesh General) ×3 IMPLANT
NS IRRIG 500ML POUR BTL (IV SOLUTION) ×3 IMPLANT
PACK LAP CHOLECYSTECTOMY (MISCELLANEOUS) ×3 IMPLANT
SEAL FOR SCOPE WARMER C3101 (MISCELLANEOUS) IMPLANT
SLEEVE ENDOPATH XCEL 5M (ENDOMECHANICALS) ×6 IMPLANT
STRIP CLOSURE SKIN 1/2X4 (GAUZE/BANDAGES/DRESSINGS) IMPLANT
SUT CHROMIC 5 0 RB 1 27 (SUTURE) ×3 IMPLANT
SUT VIC AB 0 CT2 27 (SUTURE) ×3 IMPLANT
TACKER 5MM HERNIA 3.5CML NAB (ENDOMECHANICALS) ×3 IMPLANT
TRAY FOLEY W/METER SILVER 16FR (SET/KITS/TRAYS/PACK) ×3 IMPLANT
TROCAR XCEL 12X100 BLDLESS (ENDOMECHANICALS) ×3 IMPLANT
TROCAR XCEL NON-BLD 5MMX100MML (ENDOMECHANICALS) ×3 IMPLANT
TUBING INSUFFLATOR HI FLOW (MISCELLANEOUS) ×3 IMPLANT

## 2017-10-08 NOTE — Op Note (Signed)
OPERATIVE REPORT  PREOPERATIVE  DIAGNOSIS: . Recurrent left inguinal hernia  POSTOPERATIVE DIAGNOSIS: . Recurrent left inguinal hernia  PROCEDURE: . Laparoscopic repair of recurrent left inguinal hernia  ANESTHESIA:  General  SURGEON: Rochel Brome  MD   INDICATIONS: . He has a past history of left inguinal hernia repair of both indirect and direct. He recently had recurrent bulging in the left groin. An inguinal hernia was demonstrated on physical exam and repair was recommended for definitive treatment.  With the patient on the operating table in the supine position under general anesthesia the abdomen was clipped and prepared with ChloraPrep and draped in a sterile manner. A short incision was made in the inferior aspect of the umbilicus and carried down to the deep fascia which was grasped with a laryngeal hook and elevated. A Veress needle was inserted aspirated and irrigated with a saline solution. The peritoneal cavity was insufflated with carbon dioxide. The Veress needle was removed. The 10 mm cannula was advanced down through the sheath. The 10 mm 25 laparoscope was inserted to view the peritoneal cavity. A left inguinal hernia was identified. There was no hernia seen on the right. The liver had a smooth surface. There was marked distention of the bladder. The sheets were lifted up so that the circulating nurse had access to the penis to insert a red rubber urinary catheter and drained 750 cc of clear yellow urine. On the left side a 5 mm cannula was inserted. On the right side a 5 mm cannula was inserted. An incision was made in the peritoneum anterior to the hernia defect some 5 inches in length. A peritoneal flap was raised posteriorly. The indirect hernia sac was dissected free from surrounding structures and retracted away from the abdominal wall. Circumferential dissection was carried out around the inferior epigastric vessels and around the cord structures. Bard mesh was cut to create  an oval shape of 3 x 5" with a posterior oblique slit and a small notch to straddle the inferior epigastric vessels. The mesh was inserted and passed around the cord structures and positioned. The posterior oblique slit was closed with a Bard spiral tacker. The mesh was attached to the Cooper's ligament rectus muscle and transversus abdominis with a row of tacks carried laterally to the iliopubic tract. Hemostasis was intact. Some of the carbon dioxide was released and the peritoneum was closed with the spiral tacker laparoscopic instruments were removed. The skin incisions were closed with interrupted 5-0 Monocryl subcuticular sutures and Dermabond  The patient tolerated surgery satisfactorily and was then prepared for transfer to the recovery room  [employer].D.

## 2017-10-08 NOTE — OR Nursing (Signed)
Patient had a full bladder when surgeon began laparoscopically. Dr. Tamala Julian asked me to put a straight catheter in to reduce the size of the bladder so he could perform the surgery. Upon catheterization, 750 ml of urine was removed from the bladder.

## 2017-10-08 NOTE — Anesthesia Preprocedure Evaluation (Signed)
Anesthesia Evaluation  Patient identified by MRN, date of birth, ID band Patient awake    Reviewed: Allergy & Precautions, H&P , NPO status , Patient's Chart, lab work & pertinent test results, reviewed documented beta blocker date and time   History of Anesthesia Complications Negative for: history of anesthetic complications  Airway Mallampati: II  TM Distance: >3 FB Neck ROM: full    Dental  (+) Dental Advidsory Given, Chipped, Caps   Pulmonary neg pulmonary ROS, former smoker,           Cardiovascular Exercise Tolerance: Good negative cardio ROS       Neuro/Psych negative neurological ROS  negative psych ROS   GI/Hepatic Neg liver ROS, GERD  ,  Endo/Other  negative endocrine ROS  Renal/GU negative Renal ROS  negative genitourinary   Musculoskeletal   Abdominal   Peds  Hematology negative hematology ROS (+)   Anesthesia Other Findings Past Medical History: No date: GERD (gastroesophageal reflux disease) No date: Hemorrhoids No date: Inguinal hernia No date: Substance abuse ([place])   Reproductive/Obstetrics negative OB ROS                             Anesthesia Physical Anesthesia Plan  ASA: II  Anesthesia Plan: General   Post-op Pain Management:    Induction: Intravenous  PONV Risk Score and Plan: 2 and Ondansetron and Dexamethasone  Airway Management Planned: Oral ETT  Additional Equipment:   Intra-op Plan:   Post-operative Plan: Extubation in OR  Informed Consent: I have reviewed the patients History and Physical, chart, labs and discussed the procedure including the risks, benefits and alternatives for the proposed anesthesia with the patient or authorized representative who has indicated his/her understanding and acceptance.   Dental Advisory Given  Plan Discussed with: Anesthesiologist, CRNA and Surgeon  Anesthesia Plan Comments:         Anesthesia  Quick Evaluation

## 2017-10-08 NOTE — Discharge Instructions (Addendum)
Take Tylenol or Norco if needed for pain.  May resume aspirin on Saturday.  May shower and blot dry.  Avoid straining and heavy lifting.  AMBULATORY SURGERY  DISCHARGE INSTRUCTIONS   1) The drugs that you were given will stay in your system until tomorrow so for the next 24 hours you should not:  A) Drive an automobile B) Make any legal decisions C) Drink any alcoholic beverage   2) You may resume regular meals tomorrow.  Today it is better to start with liquids and gradually work up to solid foods.  You may eat anything you prefer, but it is better to start with liquids, then soup and crackers, and gradually work up to solid foods.   3) Please notify your doctor immediately if you have any unusual bleeding, trouble breathing, redness and pain at the surgery site, drainage, fever, or pain not relieved by medication.    4) Additional Instructions:        Please contact your physician with any problems or Same Day Surgery at [phone], Monday through Friday 6 am to 4 pm, or [place] at [facility] number at [phone].

## 2017-10-08 NOTE — Anesthesia Post-op Follow-up Note (Signed)
Anesthesia QCDR form completed.        

## 2017-10-08 NOTE — Anesthesia Procedure Notes (Signed)
Procedure Name: Intubation Date/Time: 10/08/2017 9:45 AM Performed by: Allean Found Pre-anesthesia Checklist: Patient identified, Emergency Drugs available, Suction available, Patient being monitored and Timeout performed Patient Re-evaluated:Patient Re-evaluated prior to induction Oxygen Delivery Method: Circle system utilized Preoxygenation: Pre-oxygenation with 100% oxygen Induction Type: IV induction Ventilation: Mask ventilation without difficulty Laryngoscope Size: Mac and 4 Tube size: 7.5 mm Number of attempts: 1 Airway Equipment and Method: Stylet Secured at: 22 cm Tube secured with: Tape Dental Injury: Teeth and Oropharynx as per pre-operative assessment

## 2017-10-08 NOTE — Transfer of Care (Signed)
Immediate Anesthesia Transfer of Care Note  Patient: Joel Ramirez  Procedure(s) Performed: LAPAROSCOPIC INGUINAL HERNIA WITH MESH (Left )  Patient Location: PACU  Anesthesia Type:General  Level of Consciousness: awake  Airway & Oxygen Therapy: Patient Spontanous Breathing and Patient connected to face mask oxygen  Post-op Assessment: Report given to RN and Post -op Vital signs reviewed and stable  Post vital signs: Reviewed and stable  Last Vitals:  Vitals:   10/08/17 0752  BP: 136/85  Pulse: 67  Resp: 16  Temp: 36.6 C  SpO2: 97%    Last Pain:  Vitals:   10/08/17 0752  TempSrc: Tympanic         Complications: No apparent anesthesia complications

## 2017-10-09 ENCOUNTER — Encounter: Payer: Self-pay | Admitting: Surgery

## 2017-10-09 NOTE — Anesthesia Postprocedure Evaluation (Signed)
Anesthesia Post Note  Patient: Joel Ramirez  Procedure(s) Performed: LAPAROSCOPIC INGUINAL HERNIA WITH MESH (Left )  Patient location during evaluation: PACU Anesthesia Type: General Level of consciousness: awake and alert Pain management: pain level controlled Vital Signs Assessment: post-procedure vital signs reviewed and stable Respiratory status: spontaneous breathing, nonlabored ventilation, respiratory function stable and patient connected to nasal cannula oxygen Cardiovascular status: blood pressure returned to baseline and stable Postop Assessment: no apparent nausea or vomiting Anesthetic complications: no     Last Vitals:  Vitals:   10/08/17 1215 10/08/17 1232  BP: 138/86 137/79  Pulse: 63 66  Resp: 11 14  Temp: 36.6 C 36.6 C  SpO2: 100% 100%    Last Pain:  Vitals:   10/08/17 1232  TempSrc:   PainSc: 4                  Martha Clan

## 2020-04-06 ENCOUNTER — Ambulatory Visit: Payer: Self-pay | Attending: Internal Medicine

## 2020-04-06 DIAGNOSIS — Z23 Encounter for immunization: Secondary | ICD-10-CM

## 2020-04-06 NOTE — Progress Notes (Signed)
   Covid-19 Vaccination Clinic  Name:  Sylas Abila    MRN: [id number] DOB: 10-Jul-1961  04/06/2020  Mr. Bady was observed post Covid-19 immunization for 15 minutes without incident. He was provided with Vaccine Information Sheet and instruction to access the V-Safe system.   Mr. Sera was instructed to call 911 with any severe reactions post vaccine: Marland Kitchen Difficulty breathing  . Swelling of face and throat  . A fast heartbeat  . A bad rash all over body  . Dizziness and weakness   Immunizations Administered    Name Date Dose VIS Date Route   Pfizer COVID-19 Vaccine 04/06/2020 10:29 AM 0.3 mL 02/08/2019 Intramuscular   Manufacturer: [place]   Lot: [id number]   [place]: [id number]

## 2020-04-26 ENCOUNTER — Ambulatory Visit: Payer: BLUE CROSS/BLUE SHIELD | Admitting: Dermatology

## 2020-05-01 ENCOUNTER — Ambulatory Visit: Payer: PRIVATE HEALTH INSURANCE | Attending: Internal Medicine

## 2020-05-01 DIAGNOSIS — Z23 Encounter for immunization: Secondary | ICD-10-CM

## 2020-05-01 NOTE — Progress Notes (Signed)
   Covid-19 Vaccination Clinic  Name:  Joel Ramirez    MRN: [id number] DOB: 11-09-1961  05/01/2020  Mr. [name] was observed post Covid-19 immunization for 15 minutes without incident. He was provided with Vaccine Information Sheet and instruction to access the V-Safe system.   Mr. [name] was instructed to call 911 with any severe reactions post vaccine: Marland Kitchen Difficulty breathing  . Swelling of face and throat  . A fast heartbeat  . A bad rash all over body  . Dizziness and weakness   Immunizations Administered    Name Date Dose VIS Date Route   Pfizer COVID-19 Vaccine 05/01/2020 12:56 PM 0.3 mL 02/08/2019 Intramuscular   Manufacturer: [place]   Lot: [id number]   [place]: [id number]

## 2020-05-28 ENCOUNTER — Other Ambulatory Visit: Payer: Self-pay

## 2020-05-28 ENCOUNTER — Ambulatory Visit (INDEPENDENT_AMBULATORY_CARE_PROVIDER_SITE_OTHER): Payer: PRIVATE HEALTH INSURANCE | Admitting: Dermatology

## 2020-05-28 DIAGNOSIS — L814 Other melanin hyperpigmentation: Secondary | ICD-10-CM

## 2020-05-28 DIAGNOSIS — L820 Inflamed seborrheic keratosis: Secondary | ICD-10-CM

## 2020-05-28 DIAGNOSIS — D229 Melanocytic nevi, unspecified: Secondary | ICD-10-CM | POA: Diagnosis not present

## 2020-05-28 DIAGNOSIS — Z1283 Encounter for screening for malignant neoplasm of skin: Secondary | ICD-10-CM

## 2020-05-28 DIAGNOSIS — L720 Epidermal cyst: Secondary | ICD-10-CM

## 2020-05-28 DIAGNOSIS — D239 Other benign neoplasm of skin, unspecified: Secondary | ICD-10-CM

## 2020-05-28 DIAGNOSIS — L578 Other skin changes due to chronic exposure to nonionizing radiation: Secondary | ICD-10-CM

## 2020-05-28 DIAGNOSIS — L918 Other hypertrophic disorders of the skin: Secondary | ICD-10-CM

## 2020-05-28 DIAGNOSIS — D2239 Melanocytic nevi of other parts of face: Secondary | ICD-10-CM

## 2020-05-28 DIAGNOSIS — L570 Actinic keratosis: Secondary | ICD-10-CM | POA: Diagnosis not present

## 2020-05-28 DIAGNOSIS — L821 Other seborrheic keratosis: Secondary | ICD-10-CM

## 2020-05-28 DIAGNOSIS — D485 Neoplasm of uncertain behavior of skin: Secondary | ICD-10-CM | POA: Diagnosis not present

## 2020-05-28 DIAGNOSIS — D1801 Hemangioma of skin and subcutaneous tissue: Secondary | ICD-10-CM

## 2020-05-28 HISTORY — DX: Other benign neoplasm of skin, unspecified: D23.9

## 2020-05-28 NOTE — Patient Instructions (Signed)

## 2020-05-28 NOTE — Progress Notes (Signed)
Follow-Up Visit   Subjective  Joel Ramirez is a 59 y.o. male who presents for the following: Annual Exam (hx AK's - check for any new or persistent lesions). The patient presents for Total-Body Skin Exam (TBSE) for skin cancer screening and mole check.  The following portions of the chart were reviewed this encounter and updated as appropriate:  Tobacco  Allergies  Meds  Problems  Med Hx  Surg Hx  Fam Hx     Review of Systems:  No other skin or systemic complaints except as noted in HPI or Assessment and Plan.  Objective  Well appearing patient in no apparent distress; mood and affect are within normal limits.  A focused examination was performed including extremities, including the arms, hands, fingers, and fingernails and the legs, feet, toes, and toenails. Relevant physical exam findings are noted in the Assessment and Plan.  Objective  R mid med thigh: 0.5 cm dark brown macule   Objective  Face: Smooth white papule(s).   Objective  L cheek x 1: Erythematous keratotic or waxy stuck-on papule or plaque.   Objective  R temple: Erythematous thin papules/macules with gritty scale.   Objective  L cheek: Flesh colored papule   Assessment & Plan  Neoplasm of uncertain behavior of skin R mid med thigh  Epidermal / dermal shaving  Informed consent: discussed and consent obtained   Timeout: patient name, date of birth, surgical site, and procedure verified   Procedure prep:  Patient was prepped and draped in usual sterile fashion Prep type:  Isopropyl alcohol Anesthesia: the lesion was anesthetized in a standard fashion   Anesthetic:  1% lidocaine w/ epinephrine 1-100,000 buffered w/ 8.4% NaHCO3 Instrument used: flexible razor blade   Hemostasis achieved with: pressure, aluminum chloride and electrodesiccation   Outcome: patient tolerated procedure well   Post-procedure details: sterile dressing applied and wound care instructions given   Dressing type:  bandage and petrolatum   Additional details:  Post tx defect 0.8 cm   Specimen 1 - Surgical pathology Differential Diagnosis:  Check Margins: No 0.5 cm dark brown macule  Milia Face  Benign, observe.     Inflamed seborrheic keratosis L cheek x 1  Destruction of lesion - L cheek x 1 Complexity: simple   Destruction method: cryotherapy   Timeout:  patient name, date of birth, surgical site, and procedure verified Lesion destroyed using liquid nitrogen: Yes   Outcome: patient tolerated procedure well with no complications   Post-procedure details: wound care instructions given    AK (actinic keratosis) R temple  Destruction of lesion - R temple Complexity: simple   Destruction method: cryotherapy   Informed consent: discussed and consent obtained   Timeout:  patient name, date of birth, surgical site, and procedure verified Lesion destroyed using liquid nitrogen: Yes   Region frozen until ice ball extended beyond lesion: Yes   Outcome: patient tolerated procedure well with no complications   Post-procedure details: wound care instructions given    Nevus L cheek  Skin cancer screening   Lentigines - Scattered tan macules - Discussed due to sun exposure - Benign, observe - Call for any changes  Seborrheic Keratoses - Stuck-on, waxy, tan-brown papules and plaques  - Discussed benign etiology and prognosis. - Observe - Call for any changes  Melanocytic Nevi - Tan-brown and/or pink-flesh-colored symmetric macules and papules - Benign appearing on exam today - Observation - Call clinic for new or changing moles - Recommend daily use of broad spectrum spf  30+ sunscreen to sun-exposed areas.   Hemangiomas - Red papules - Discussed benign nature - Observe - Call for any changes  Actinic Damage - diffuse scaly erythematous macules with underlying dyspigmentation - Recommend daily broad spectrum sunscreen SPF 30+ to sun-exposed areas, reapply every 2 hours as  needed.  - Call for new or changing lesions.  Acrochordons (Skin Tags) - Fleshy, skin-colored pedunculated papules - Benign appearing.  - Observe. - If desired, they can be removed with an in office procedure that is not covered by insurance. - Please call the clinic if you notice any new or changing lesions.   Skin cancer screening performed today.   Return in about 1 year (around 05/28/2021) for TBSE.  Luther Redo, CMA, am acting as scribe for Sarina Ser, MD .  Documentation: I have reviewed the above documentation for accuracy and completeness, and I agree with the above.  Sarina Ser, MD

## 2020-05-30 ENCOUNTER — Encounter: Payer: Self-pay | Admitting: Dermatology

## 2020-05-30 ENCOUNTER — Telehealth: Payer: Self-pay

## 2020-05-30 NOTE — Telephone Encounter (Signed)
Patient advised of biopsy results and scheduled for surgery. °

## 2020-05-30 NOTE — Telephone Encounter (Signed)
-----   Message from Ralene Bathe, MD sent at 05/29/2020  7:17 PM EDT ----- Skin , right mid med thigh DYSPLASTIC COMPOUND NEVUS WITH SEVERE ATYPIA  Severe Dysplastic Schedule surgery

## 2020-05-30 NOTE — Telephone Encounter (Signed)
Left message on voicemail to return my call.  

## 2020-07-10 ENCOUNTER — Ambulatory Visit (INDEPENDENT_AMBULATORY_CARE_PROVIDER_SITE_OTHER): Payer: PRIVATE HEALTH INSURANCE | Admitting: Dermatology

## 2020-07-10 ENCOUNTER — Telehealth: Payer: Self-pay

## 2020-07-10 ENCOUNTER — Other Ambulatory Visit: Payer: Self-pay

## 2020-07-10 DIAGNOSIS — D485 Neoplasm of uncertain behavior of skin: Secondary | ICD-10-CM

## 2020-07-10 MED ORDER — MUPIROCIN 2 % EX OINT: 1.0000 "application " | TOPICAL_OINTMENT | Freq: Every day | CUTANEOUS | 0 refills | Status: DC

## 2020-07-10 NOTE — Telephone Encounter (Signed)
Pt doing well after today's surgery.  Advised pt to call if any problems, otherwise we would see him next week at his f/u./sh

## 2020-07-10 NOTE — Progress Notes (Signed)
   Follow-Up Visit   Subjective  Joel Ramirez is a 59 y.o. male who presents for the following: Procedure (Severe dysplastic nevus of right mid med thigh - [place] today).  The following portions of the chart were reviewed this encounter and updated as appropriate:  Tobacco  Allergies  Meds  Problems  Med Hx  Surg Hx  Fam Hx     Review of Systems:  No other skin or systemic complaints except as noted in HPI or Assessment and Plan.  Objective  Well appearing patient in no apparent distress; mood and affect are within normal limits.  A focused examination was performed including right thigh. Relevant physical exam findings are noted in the Assessment and Plan.  Objective  Right mid med thigh: Healing biopsy site   Assessment & Plan    Neoplasm of uncertain behavior of skin -biopsy-proven severe dysplastic nevus Right mid med thigh  Skin excision  Lesion length (cm):  1.1 Lesion width (cm):  1.2 Margin per side (cm):  0.2 Total excision diameter (cm):  1.6 Informed consent: discussed and consent obtained   Timeout: patient name, date of birth, surgical site, and procedure verified   Procedure prep:  Patient was prepped and draped in usual sterile fashion Prep type:  Isopropyl alcohol and povidone-iodine Anesthesia: the lesion was anesthetized in a standard fashion   Anesthetic:  1% lidocaine w/ epinephrine 1-100,000 buffered w/ 8.4% NaHCO3 Instrument used: #15 blade   Hemostasis achieved with: pressure   Hemostasis achieved with comment:  Electrocautery Outcome: patient tolerated procedure well with no complications   Post-procedure details: sterile dressing applied and wound care instructions given   Dressing type: bandage and pressure dressing (mupirocin)    Skin repair Complexity:  Complex Final length (cm):  4 Reason for type of repair: reduce tension to allow closure, reduce the risk of dehiscence, infection, and necrosis, reduce subcutaneous dead space and  avoid a hematoma, allow closure of the large defect, preserve normal anatomy, preserve normal anatomical and functional relationships and enhance both functionality and cosmetic results   Undermining: area extensively undermined   Undermining comment:  Undermining defect 1.5 cm Subcutaneous layers (deep stitches):  Suture size:  3-0 Suture type: Vicryl (polyglactin 910)   Subcutaneous suture technique: inverted dermal. Fine/surface layer approximation (top stitches):  Suture size:  3-0 Suture type: nylon   Stitches: simple running   Suture removal (days):  7 Hemostasis achieved with: suture and pressure Outcome: patient tolerated procedure well with no complications   Post-procedure details: sterile dressing applied and wound care instructions given   Dressing type: bandage and pressure dressing (mupirocin)    mupirocin ointment (BACTROBAN) 2 %  Specimen 1 - Surgical pathology Differential Diagnosis: Severe dysplastic nevus Check Margins: Yes Healing biopsy site  Return in about 1 week (around 07/17/2020) for suture removal.  I, Ashok Cordia, CMA, am acting as scribe for Sarina Ser, MD .  Documentation: I have reviewed the above documentation for accuracy and completeness, and I agree with the above.  Sarina Ser, MD

## 2020-07-10 NOTE — Patient Instructions (Signed)

## 2020-07-13 ENCOUNTER — Encounter: Payer: Self-pay | Admitting: Dermatology

## 2020-07-17 ENCOUNTER — Ambulatory Visit (INDEPENDENT_AMBULATORY_CARE_PROVIDER_SITE_OTHER): Payer: PRIVATE HEALTH INSURANCE | Admitting: Dermatology

## 2020-07-17 ENCOUNTER — Other Ambulatory Visit: Payer: Self-pay

## 2020-07-17 DIAGNOSIS — D239 Other benign neoplasm of skin, unspecified: Secondary | ICD-10-CM

## 2020-07-17 DIAGNOSIS — D2271 Melanocytic nevi of right lower limb, including hip: Secondary | ICD-10-CM

## 2020-07-17 DIAGNOSIS — Z4802 Encounter for removal of sutures: Secondary | ICD-10-CM

## 2020-07-17 NOTE — Progress Notes (Signed)
   Follow-Up Visit   Subjective  Joel Ramirez is a 59 y.o. male who presents for the following: post op (patient is here today for suture removal. He has experienced some pain at site.).  The following portions of the chart were reviewed this encounter and updated as appropriate:  Tobacco  Allergies  Meds  Problems  Med Hx  Surg Hx  Fam Hx     Review of Systems:  No other skin or systemic complaints except as noted in HPI or Assessment and Plan.  Objective  Well appearing patient in no apparent distress; mood and affect are within normal limits.  A focused examination was performed including the legs. Relevant physical exam findings are noted in the Assessment and Plan.  Objective  R mid med thigh: Healing excision site  Assessment & Plan  Dysplastic nevus biopsy proven, post op -S/P excision - pathology pending R mid med thigh  Encounter for Removal of Sutures - Incision site at the right mid med thigh is clean, dry and intact - Wound cleansed, sutures removed, wound cleansed and steri strips applied.  - Will contact patient with pathology results when available. - Patient advised to keep steri-strips dry until they fall off. - Scars remodel for a full year. - Once steri-strips fall off, patient can apply over-the-counter silicone scar cream each night to help with scar remodeling if desired. - Patient advised to call with any concerns or if they notice any new or changing lesions.   Return for appointment as scheduled.  Luther Redo, CMA, am acting as scribe for Sarina Ser, MD .  Documentation: I have reviewed the above documentation for accuracy and completeness, and I agree with the above.  Sarina Ser, MD

## 2020-07-18 ENCOUNTER — Encounter: Payer: Self-pay | Admitting: Dermatology

## 2020-07-19 ENCOUNTER — Telehealth: Payer: Self-pay

## 2020-07-19 NOTE — Telephone Encounter (Signed)
Advised patient of results/hd  

## 2020-07-19 NOTE — Telephone Encounter (Signed)
-----   Message from Ralene Bathe, MD sent at 07/19/2020 11:42 AM EDT ----- Skin (M), right mid med thigh EXCISION, SCATTERED ATYPICAL MELANOCYTES, DIAGNOSTIC NEVUS NOT SEEN, MARGINS FREE,  Severe dysplastic Margins free

## 2021-05-24 ENCOUNTER — Other Ambulatory Visit: Payer: Self-pay

## 2021-05-24 ENCOUNTER — Ambulatory Visit (INDEPENDENT_AMBULATORY_CARE_PROVIDER_SITE_OTHER): Payer: 59 | Admitting: Dermatology

## 2021-05-24 DIAGNOSIS — Z86018 Personal history of other benign neoplasm: Secondary | ICD-10-CM

## 2021-05-24 DIAGNOSIS — L821 Other seborrheic keratosis: Secondary | ICD-10-CM

## 2021-05-24 DIAGNOSIS — L578 Other skin changes due to chronic exposure to nonionizing radiation: Secondary | ICD-10-CM

## 2021-05-24 DIAGNOSIS — L570 Actinic keratosis: Secondary | ICD-10-CM

## 2021-05-24 DIAGNOSIS — R238 Other skin changes: Secondary | ICD-10-CM

## 2021-05-24 DIAGNOSIS — Z1283 Encounter for screening for malignant neoplasm of skin: Secondary | ICD-10-CM | POA: Diagnosis not present

## 2021-05-24 DIAGNOSIS — L814 Other melanin hyperpigmentation: Secondary | ICD-10-CM

## 2021-05-24 DIAGNOSIS — R21 Rash and other nonspecific skin eruption: Secondary | ICD-10-CM | POA: Diagnosis not present

## 2021-05-24 DIAGNOSIS — D1800 Hemangioma unspecified site: Secondary | ICD-10-CM

## 2021-05-24 DIAGNOSIS — D229 Melanocytic nevi, unspecified: Secondary | ICD-10-CM

## 2021-05-24 NOTE — Progress Notes (Signed)
   Follow-Up Visit   Subjective  Joel Ramirez is a 60 y.o. male who presents for the following: Annual Exam (History of dysplastic nevus - TBSE today). The patient presents for Total-Body Skin Exam (TBSE) for skin cancer screening and mole check.  The following portions of the chart were reviewed this encounter and updated as appropriate:   Tobacco  Allergies  Meds  Problems  Med Hx  Surg Hx  Fam Hx      Review of Systems:  No other skin or systemic complaints except as noted in HPI or Assessment and Plan.  Objective  Well appearing patient in no apparent distress; mood and affect are within normal limits.  A full examination was performed including scalp, head, eyes, ears, nose, lips, neck, chest, axillae, abdomen, back, buttocks, bilateral upper extremities, bilateral lower extremities, hands, feet, fingers, toes, fingernails, and toenails. All findings within normal limits unless otherwise noted below.  Scalp (2) Erythematous thin papules/macules with gritty scale.   Right chest Pink patch  Left lat neck Purple papule   Assessment & Plan   Lentigines - Scattered tan macules - Due to sun exposure - Benign-appering, observe - Recommend daily broad spectrum sunscreen SPF 30+ to sun-exposed areas, reapply every 2 hours as needed. - Call for any changes  Seborrheic Keratoses - Stuck-on, waxy, tan-brown papules and/or plaques  - Benign-appearing - Discussed benign etiology and prognosis. - Observe - Call for any changes  Melanocytic Nevi - Tan-brown and/or pink-flesh-colored symmetric macules and papules - Benign appearing on exam today - Observation - Call clinic for new or changing moles - Recommend daily use of broad spectrum spf 30+ sunscreen to sun-exposed areas.   Hemangiomas - Red papules - Discussed benign nature - Observe - Call for any changes  Actinic Damage - Chronic condition, secondary to cumulative UV/sun exposure - diffuse scaly  erythematous macules with underlying dyspigmentation - Recommend daily broad spectrum sunscreen SPF 30+ to sun-exposed areas, reapply every 2 hours as needed.  - Staying in the shade or wearing long sleeves, sun glasses (UVA+UVB protection) and wide brim hats (4-inch brim around the entire circumference of the hat) are also recommended for sun protection.  - Call for new or changing lesions.  Skin cancer screening performed today.  History of Dysplastic Nevi - No evidence of recurrence today - Recommend regular full body skin exams - Recommend daily broad spectrum sunscreen SPF 30+ to sun-exposed areas, reapply every 2 hours as needed.  - Call if any new or changing lesions are noted between office visits  AK (actinic keratosis) (2) Scalp  Destruction of lesion - Scalp Complexity: simple   Destruction method: cryotherapy   Informed consent: discussed and consent obtained   Timeout:  patient name, date of birth, surgical site, and procedure verified Lesion destroyed using liquid nitrogen: Yes   Region frozen until ice ball extended beyond lesion: Yes   Outcome: patient tolerated procedure well with no complications   Post-procedure details: wound care instructions given    Rash Right chest  Benign appearing. Recheck if persistent or worsening.  Venous lake Left lat neck  Benign, observe.    Skin cancer screening  Return in about 1 year (around 05/24/2022).  I, Ashok Cordia, CMA, am acting as scribe for Sarina Ser, MD .  Documentation: I have reviewed the above documentation for accuracy and completeness, and I agree with the above.  Sarina Ser, MD

## 2021-05-24 NOTE — Patient Instructions (Signed)

## 2021-05-28 ENCOUNTER — Encounter: Payer: Self-pay | Admitting: Dermatology

## 2021-05-30 ENCOUNTER — Encounter: Payer: PRIVATE HEALTH INSURANCE | Admitting: Dermatology

## 2021-12-15 HISTORY — PX: HERNIA REPAIR: SHX51

## 2022-05-20 DIAGNOSIS — I951 Orthostatic hypotension: Secondary | ICD-10-CM | POA: Diagnosis not present

## 2022-05-20 DIAGNOSIS — Z8249 Family history of ischemic heart disease and other diseases of the circulatory system: Secondary | ICD-10-CM | POA: Diagnosis not present

## 2022-05-20 DIAGNOSIS — Z811 Family history of alcohol abuse and dependence: Secondary | ICD-10-CM | POA: Diagnosis not present

## 2022-05-20 DIAGNOSIS — Z803 Family history of malignant neoplasm of breast: Secondary | ICD-10-CM | POA: Diagnosis not present

## 2022-05-20 DIAGNOSIS — G8929 Other chronic pain: Secondary | ICD-10-CM | POA: Diagnosis not present

## 2022-05-20 DIAGNOSIS — Z87891 Personal history of nicotine dependence: Secondary | ICD-10-CM | POA: Diagnosis not present

## 2022-05-20 DIAGNOSIS — Z825 Family history of asthma and other chronic lower respiratory diseases: Secondary | ICD-10-CM | POA: Diagnosis not present

## 2022-05-20 DIAGNOSIS — Z791 Long term (current) use of non-steroidal anti-inflammatories (NSAID): Secondary | ICD-10-CM | POA: Diagnosis not present

## 2022-05-21 ENCOUNTER — Ambulatory Visit: Payer: 59 | Admitting: Dermatology

## 2022-05-21 DIAGNOSIS — R030 Elevated blood-pressure reading, without diagnosis of hypertension: Secondary | ICD-10-CM | POA: Diagnosis not present

## 2022-05-21 DIAGNOSIS — S46812A Strain of other muscles, fascia and tendons at shoulder and upper arm level, left arm, initial encounter: Secondary | ICD-10-CM | POA: Diagnosis not present

## 2022-05-21 DIAGNOSIS — M792 Neuralgia and neuritis, unspecified: Secondary | ICD-10-CM | POA: Diagnosis not present

## 2022-06-04 DIAGNOSIS — N451 Epididymitis: Secondary | ICD-10-CM | POA: Diagnosis not present

## 2022-06-04 DIAGNOSIS — N50812 Left testicular pain: Secondary | ICD-10-CM | POA: Diagnosis not present

## 2022-06-04 DIAGNOSIS — N5089 Other specified disorders of the male genital organs: Secondary | ICD-10-CM | POA: Diagnosis not present

## 2022-06-24 ENCOUNTER — Ambulatory Visit: Payer: 59 | Admitting: Dermatology

## 2022-06-24 DIAGNOSIS — L719 Rosacea, unspecified: Secondary | ICD-10-CM | POA: Diagnosis not present

## 2022-06-24 DIAGNOSIS — D229 Melanocytic nevi, unspecified: Secondary | ICD-10-CM

## 2022-06-24 DIAGNOSIS — D1800 Hemangioma unspecified site: Secondary | ICD-10-CM | POA: Diagnosis not present

## 2022-06-24 DIAGNOSIS — L821 Other seborrheic keratosis: Secondary | ICD-10-CM

## 2022-06-24 DIAGNOSIS — R238 Other skin changes: Secondary | ICD-10-CM

## 2022-06-24 DIAGNOSIS — I8393 Asymptomatic varicose veins of bilateral lower extremities: Secondary | ICD-10-CM

## 2022-06-24 DIAGNOSIS — Z1283 Encounter for screening for malignant neoplasm of skin: Secondary | ICD-10-CM

## 2022-06-24 DIAGNOSIS — L814 Other melanin hyperpigmentation: Secondary | ICD-10-CM | POA: Diagnosis not present

## 2022-06-24 DIAGNOSIS — L578 Other skin changes due to chronic exposure to nonionizing radiation: Secondary | ICD-10-CM | POA: Diagnosis not present

## 2022-06-24 DIAGNOSIS — Z86018 Personal history of other benign neoplasm: Secondary | ICD-10-CM | POA: Diagnosis not present

## 2022-06-24 NOTE — Progress Notes (Signed)
Follow-Up Visit   Subjective  Joel Ramirez is a 61 y.o. male who presents for the following: Annual Exam (Hx dysplastic nevus, AK's ). The patient presents for Total-Body Skin Exam (TBSE) for skin cancer screening and mole check.  The patient has spots, moles and lesions to be evaluated, some may be new or changing.  The following portions of the chart were reviewed this encounter and updated as appropriate:   Tobacco  Allergies  Meds  Problems  Med Hx  Surg Hx  Fam Hx     Review of Systems:  No other skin or systemic complaints except as noted in HPI or Assessment and Plan.  Objective  Well appearing patient in no apparent distress; mood and affect are within normal limits.  A full examination was performed including scalp, head, eyes, ears, nose, lips, neck, chest, axillae, abdomen, back, buttocks, bilateral upper extremities, bilateral lower extremities, hands, feet, fingers, toes, fingernails, and toenails. All findings within normal limits unless otherwise noted below.  Face Dilated blood vessels.  L neck Dark purple macule.   Assessment & Plan  Rosacea Face Rosacea is a chronic progressive skin condition usually affecting the face of adults, causing redness and/or acne bumps. It is treatable but not curable. It sometimes affects the eyes (ocular rosacea) as well. It may respond to topical and/or systemic medication and can flare with stress, sun exposure, alcohol, exercise and some foods.  Daily application of broad spectrum spf 30+ sunscreen to face is recommended to reduce flares.  Discussed the treatment option of BBL/laser.  Typically we recommend 1-3 treatment sessions about 5-8 weeks apart for best results.  The patient's condition may require "maintenance treatments" in the future.  The fee for BBL / laser treatments is $350 per treatment session for the whole face.  A fee can be quoted for other parts of the body. Insurance typically does not pay for BBL/laser  treatments and therefore the fee is an out-of-pocket cost.;  Venous lake L neck Vs hemangioma - Benign-appearing.  Observation.  Call clinic for new or changing lesions.  Recommend daily use of broad spectrum spf 30+ sunscreen to sun-exposed areas.   Discussed the treatment option of BBL/laser.  Typically we recommend 1-3 treatment sessions about 5-8 weeks apart for best results.  The patient's condition may require "maintenance treatments" in the future.  The fee for BBL / laser treatments is $350 per treatment session for the whole face.  A fee can be quoted for other parts of the body. Insurance typically does not pay for BBL/laser treatments and therefore the fee is an out-of-pocket cost.  Skin cancer screening  Lentigines - Scattered tan macules - Due to sun exposure - Benign-appearing, observe - Recommend daily broad spectrum sunscreen SPF 30+ to sun-exposed areas, reapply every 2 hours as needed. - Call for any changes  Seborrheic Keratoses - Stuck-on, waxy, tan-brown papules and/or plaques  - Benign-appearing - Discussed benign etiology and prognosis. - Observe - Call for any changes  Melanocytic Nevi - Tan-brown and/or pink-flesh-colored symmetric macules and papules - Benign appearing on exam today - Observation - Call clinic for new or changing moles - Recommend daily use of broad spectrum spf 30+ sunscreen to sun-exposed areas.   Hemangiomas - Red papules - Discussed benign nature - Observe - Call for any changes  Actinic Damage - Chronic condition, secondary to cumulative UV/sun exposure - diffuse scaly erythematous macules with underlying dyspigmentation - Recommend daily broad spectrum sunscreen SPF 30+ to sun-exposed  areas, reapply every 2 hours as needed.  - Staying in the shade or wearing long sleeves, sun glasses (UVA+UVB protection) and wide brim hats (4-inch brim around the entire circumference of the hat) are also recommended for sun protection.  -  Call for new or changing lesions.  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  - Call if any new or changing lesions are noted between office visits  Varicose Veins/Spider Veins - Dilated blue, purple or red veins at the lower extremities - Reassured - Smaller vessels can be treated by sclerotherapy (a procedure to inject a medicine into the veins to make them disappear) if desired, but the treatment is not covered by insurance. Larger vessels may be covered if symptomatic and we would refer to vascular surgeon if treatment desired.  Skin cancer screening performed today.  Return in about 1 year (around 06/25/2023) for TBSE.  Luther Redo, CMA, am acting as scribe for Sarina Ser, MD . Documentation: I have reviewed the above documentation for accuracy and completeness, and I agree with the above.  Sarina Ser, MD

## 2022-06-24 NOTE — Patient Instructions (Signed)
Due to recent changes in healthcare laws, you may see results of your pathology and/or laboratory studies on MyChart before the doctors have had a chance to review them. We understand that in some cases there may be results that are confusing or concerning to you. Please understand that not all results are received at the same time and often the doctors may need to interpret multiple results in order to provide you with the best plan of care or course of treatment. Therefore, we ask that you please give us 2 business days to thoroughly review all your results before contacting the office for clarification. Should we see a critical lab result, you will be contacted sooner.   If You Need Anything After Your Visit  If you have any questions or concerns for your doctor, please call our main line at [phone] and press option 4 to reach your doctor's medical assistant. If no one answers, please leave a voicemail as directed and we will return your call as soon as possible. Messages left after 4 pm will be answered the following business day.   You may also send us a message via MyChart. We typically respond to MyChart messages within 1-2 business days.  For prescription refills, please ask your pharmacy to contact our office. Our fax number is [phone].  If you have an urgent issue when the clinic is closed that cannot wait until the next business day, you can page your doctor at the number below.    Please note that while we do our best to be available for urgent issues outside of office hours, we are not available 24/7.   If you have an urgent issue and are unable to reach us, you may choose to seek medical care at your doctor's office, retail clinic, urgent care center, or emergency room.  If you have a medical emergency, please immediately call 911 or go to the emergency department.  Pager Numbers  - Dr. Kowalski: [phone]  - Dr. Moye: [phone]  - Dr. Stewart:  [phone]  In the event of inclement weather, please call our main line at [phone] for an update on the status of any delays or closures.  Dermatology Medication Tips: Please keep the boxes that topical medications come in in order to help keep track of the instructions about where and how to use these. Pharmacies typically print the medication instructions only on the boxes and not directly on the medication tubes.   If your medication is too expensive, please contact our office at [phone] option 4 or send us a message through MyChart.   We are unable to tell what your co-pay for medications will be in advance as this is different depending on your insurance coverage. However, we may be able to find a substitute medication at lower cost or fill out paperwork to get insurance to cover a needed medication.   If a prior authorization is required to get your medication covered by your insurance company, please allow us 1-2 business days to complete this process.  Drug prices often vary depending on where the prescription is filled and some pharmacies may offer cheaper prices.  The website www.goodrx.com contains coupons for medications through different pharmacies. The prices here do not account for what the cost may be with help from insurance (it may be cheaper with your insurance), but the website can give you the price if you did not use any insurance.  - You can print the associated coupon and take it with   your prescription to the pharmacy.  - You may also stop by our office during regular business hours and pick up a GoodRx coupon card.  - If you need your prescription sent electronically to a different pharmacy, notify our office through [facility] MyChart or by phone at [phone] option 4.     Si Usted Necesita Algo Despus de Su Visita  Tambin puede enviarnos un mensaje a travs de MyChart. Por lo general respondemos a los mensajes de MyChart en el transcurso de 1 a 2  das hbiles.  Para renovar recetas, por favor pida a su farmacia que se ponga en contacto con nuestra oficina. Nuestro nmero de fax es el [phone].  Si tiene un asunto urgente cuando la clnica est cerrada y que no puede esperar hasta el siguiente da hbil, puede llamar/localizar a su doctor(a) al nmero que aparece a continuacin.   Por favor, tenga en cuenta que aunque hacemos todo lo posible para estar disponibles para asuntos urgentes fuera del horario de oficina, no estamos disponibles las 24 horas del da, los 7 das de la semana.   Si tiene un problema urgente y no puede comunicarse con nosotros, puede optar por buscar atencin mdica  en el consultorio de su doctor(a), en una clnica privada, en un centro de atencin urgente o en una sala de emergencias.  Si tiene una emergencia mdica, por favor llame inmediatamente al 911 o vaya a la sala de emergencias.  Nmeros de bper  - Dr. Kowalski: [phone]  - Dra. Moye: [phone]  - Dra. Stewart: [phone]  En caso de inclemencias del tiempo, por favor llame a nuestra lnea principal al [phone] para una actualizacin sobre el estado de cualquier retraso o cierre.  Consejos para la medicacin en dermatologa: Por favor, guarde las cajas en las que vienen los medicamentos de uso tpico para ayudarle a seguir las instrucciones sobre dnde y cmo usarlos. Las farmacias generalmente imprimen las instrucciones del medicamento slo en las cajas y no directamente en los tubos del medicamento.   Si su medicamento es muy caro, por favor, pngase en contacto con nuestra oficina llamando al [phone] y presione la opcin 4 o envenos un mensaje a travs de MyChart.   No podemos decirle cul ser su copago por los medicamentos por adelantado ya que esto es diferente dependiendo de la cobertura de su seguro. Sin embargo, es posible que podamos encontrar un medicamento sustituto a menor costo o llenar un formulario para que el  seguro cubra el medicamento que se considera necesario.   Si se requiere una autorizacin previa para que su compaa de seguros cubra su medicamento, por favor permtanos de 1 a 2 das hbiles para completar este proceso.  Los precios de los medicamentos varan con frecuencia dependiendo del lugar de dnde se surte la receta y alguna farmacias pueden ofrecer precios ms baratos.  El sitio web www.goodrx.com tiene cupones para medicamentos de diferentes farmacias. Los precios aqu no tienen en cuenta lo que podra costar con la ayuda del seguro (puede ser ms barato con su seguro), pero el sitio web puede darle el precio si no utiliz ningn seguro.  - Puede imprimir el cupn correspondiente y llevarlo con su receta a la farmacia.  - Tambin puede pasar por nuestra oficina durante el horario de atencin regular y recoger una tarjeta de cupones de GoodRx.  - Si necesita que su receta se enve electrnicamente a una farmacia diferente, informe a nuestra oficina a travs de MyChart de    o por telfono llamando al [phone] y presione la opcin 4.  

## 2022-07-02 ENCOUNTER — Encounter: Payer: Self-pay | Admitting: Dermatology

## 2022-11-19 DIAGNOSIS — Z87438 Personal history of other diseases of male genital organs: Secondary | ICD-10-CM | POA: Diagnosis not present

## 2022-11-19 DIAGNOSIS — Z23 Encounter for immunization: Secondary | ICD-10-CM | POA: Diagnosis not present

## 2022-11-19 DIAGNOSIS — K4091 Unilateral inguinal hernia, without obstruction or gangrene, recurrent: Secondary | ICD-10-CM | POA: Diagnosis not present

## 2022-12-22 ENCOUNTER — Ambulatory Visit: Payer: 59 | Admitting: Surgery

## 2022-12-22 ENCOUNTER — Encounter: Payer: Self-pay | Admitting: Surgery

## 2022-12-22 ENCOUNTER — Other Ambulatory Visit: Payer: Self-pay

## 2022-12-22 VITALS — BP 156/99 | HR 73 | Temp 98.1°F | Ht 73.0 in | Wt 183.0 lb

## 2022-12-22 DIAGNOSIS — K4091 Unilateral inguinal hernia, without obstruction or gangrene, recurrent: Secondary | ICD-10-CM

## 2022-12-22 DIAGNOSIS — K4020 Bilateral inguinal hernia, without obstruction or gangrene, not specified as recurrent: Secondary | ICD-10-CM

## 2022-12-22 NOTE — Patient Instructions (Addendum)
CT scheduled on 12/26/22 @ 10:45 am. Nothing to eat/drink 4 hours prior.  Outpatient Imaging Uncertain [place]  Please see your follow up appointment listed below.     Inguinal Hernia, Adult An inguinal hernia develops when fat or the intestines push through a weak spot in a muscle where the leg meets the lower abdomen (groin). This creates a bulge. This kind of hernia could also be: In the scrotum, if you are male. In folds of skin around the vagina, if you are male. There are three types of inguinal hernias: Hernias that can be pushed back into the abdomen (are reducible). This type rarely causes pain. Hernias that are not reducible (are incarcerated). Hernias that are not reducible and lose their blood supply (are strangulated). This type of hernia requires emergency surgery. What are the causes? This condition is caused by having a weak spot in the muscles or tissues in your groin. This develops over time. The hernia may poke through the weak spot when you suddenly strain your lower abdominal muscles, such as when you: Lift a heavy object. Strain to have a bowel movement. Constipation can lead to straining. Cough. What increases the risk? This condition is more likely to develop in: Males. Pregnant females. People who: Are overweight. Work in jobs that require long periods of standing or heavy lifting. Have had an inguinal hernia before. Smoke or have lung disease. These factors can lead to long-term (chronic) coughing. What are the signs or symptoms? Symptoms may depend on the size of the hernia. Often, a small inguinal hernia has no symptoms. Symptoms of a larger hernia may include: A bulge in the groin area. This is easier to see when standing. It might not be visible when lying down. Pain or burning in the groin. This may get worse when lifting, straining, or coughing. A dull ache or a feeling of pressure in the groin. An unusual bulge in the scrotum,  in males. Symptoms of a strangulated inguinal hernia may include: A bulge in your groin that is very painful and tender to the touch. A bulge that turns red or purple. Fever, nausea, and vomiting. Inability to have a bowel movement or to pass gas. How is this diagnosed? This condition is diagnosed based on your symptoms, your medical history, and a physical exam. Your health care provider may feel your groin area and ask you to cough. How is this treated? Treatment depends on the size of your hernia and whether you have symptoms. If you do not have symptoms, your health care provider may have you watch your hernia carefully and have you come in for follow-up visits. If your hernia is large or if you have symptoms, you may need surgery to repair the hernia. Follow these instructions at home: Lifestyle Avoid lifting heavy objects. Avoid standing for long periods of time. Do not use any products that contain nicotine or tobacco. These products include cigarettes, chewing tobacco, and vaping devices, such as e-cigarettes. If you need help quitting, ask your health care provider. Maintain a healthy weight. Preventing constipation You may need to take these actions to prevent or treat constipation: Drink enough fluid to keep your urine pale yellow. Take over-the-counter or prescription medicines. Eat foods that are high in fiber, such as beans, whole grains, and fresh fruits and vegetables. Limit foods that are high in fat and processed sugars, such as fried or sweet foods. General instructions You may try to push the hernia back in place by  very gently pressing on it while lying down. Do not try to force the bulge back in if it will not push in easily. Watch your hernia for any changes in shape, size, or color. Get help right away if you notice any changes. Take over-the-counter and prescription medicines only as told by your health care provider. Keep all follow-up visits. This is  important. Contact a health care provider if: You have a fever or chills. You develop new symptoms. Your symptoms get worse. Get help right away if: You have pain in your groin that suddenly gets worse. You have a bulge in your groin that: Suddenly gets bigger and does not get smaller. Becomes red or purple or painful to the touch. You are a man and you have a sudden pain in your scrotum, or the size of your scrotum suddenly changes. You cannot push the hernia back in place by very gently pressing on it when you are lying down. You have nausea or vomiting that does not go away. You have a fast heartbeat. You cannot have a bowel movement or pass gas. These symptoms may represent a serious problem that is an emergency. Do not wait to see if the symptoms will go away. Get medical help right away. Call your local emergency services (911 in the [place] Summary An inguinal hernia develops when fat or the intestines push through a weak spot in a muscle where your leg meets your lower abdomen (groin). This condition is caused by having a weak spot in muscles or tissues in your groin. Symptoms may depend on the size of the hernia, and they may include pain or swelling in your groin. A small inguinal hernia often has no symptoms. Treatment may not be needed if you do not have symptoms. If you have symptoms or a large hernia, you may need surgery to repair the hernia. Avoid lifting heavy objects. Also, avoid standing for long periods of time. This information is not intended to replace advice given to you by your health care provider. Make sure you discuss any questions you have with your health care provider. Document Revised: 07/31/2020 Document Reviewed: 07/31/2020 Elsevier Patient Education  [place].

## 2022-12-25 ENCOUNTER — Encounter: Payer: Self-pay | Admitting: Surgery

## 2022-12-25 NOTE — Progress Notes (Signed)
Patient ID: Joel Ramirez, male   DOB: September 14, 1961, 62 y.o.   MRN: [id number]  HPI Joel Ramirez is a 62 y.o. male patient in consultation at the request of Dr.Babaoff .  He comes in with a bulge to the right inguinal area.  Of note he has had initial open bilateral inguinal hernia repair by Dr. Tamala Julian in 2017.  This repair was done with mesh.  Subsequently the patient had a recurrence on the left side and Dr. Tamala Julian did a laparoscopic TAP Left  repair with mesh  BARD a slit was cut 2018.  A few months ago, he noticed a bulge on the right side that creates some intermittent pain that is mild to moderate and sharp.  Pain seems to be worsening with Valsalva.  No fevers, no chills. I have personally review both operative reports in detail HPI  Past Medical History:  Diagnosis Date   Actinic keratosis    Dysplastic nevus 05/28/2020   Right mid medial thigh. Severe atypia, peripheral margin involved.   GERD (gastroesophageal reflux disease)    Hemorrhoids    Inguinal hernia    Substance abuse [facility])     Past Surgical History:  Procedure Laterality Date   COLONOSCOPY WITH PROPOFOL N/A 06/29/2015   Procedure: COLONOSCOPY WITH PROPOFOL;  Surgeon: Josefine Class, MD;  Location: [facility] ENDOSCOPY;  Service: Endoscopy;  Laterality: N/A;   INGUINAL HERNIA REPAIR Bilateral 02/12/2016   Procedure: HERNIA REPAIR INGUINAL ADULT BILATERAL;  Surgeon: Leonie Green, MD;  Location: [facility] ORS;  Service: General;  Laterality: Bilateral;   INGUINAL HERNIA REPAIR Left 10/08/2017   Procedure: LAPAROSCOPIC INGUINAL HERNIA WITH MESH;  Surgeon: Leonie Green, MD;  Location: [facility] ORS;  Service: General;  Laterality: Left;   KNEE ARTHROSCOPY Left     History reviewed. No pertinent family history.  Social History Social History   Tobacco Use   Smoking status: Former    Packs/day: 1.00    Years: 20.00    Total pack years: 20.00    Types: Cigarettes    Quit date: 02/04/2003    Years since quitting:  19.9   Smokeless tobacco: Never  Vaping Use   Vaping Use: Every day  Substance Use Topics   Alcohol use: No    Comment: PT IS  AN ALCOHOLIC-LAST HAD ALCOHOL 5 MONTHS AGO PER PT ON 10-02-17   Drug use: No    Types: Marijuana    Comment: NO MARIJUANA SINCE 2017 PER PT (10-02-17)    Allergies  Allergen Reactions   Penicillins Rash    LAST HAD AS A CHILD AT AGE 27 Has patient had a PCN reaction causing immediate rash, facial/tongue/throat swelling, SOB or lightheadedness with hypotension: Yes Has patient had a PCN reaction causing severe rash involving mucus membranes or skin necrosis: No Has patient had a PCN reaction that required hospitalization: Unknown Has patient had a PCN reaction occurring within the last 10 years: No If all of the above answers are "NO", then may proceed with Cephalosporin use.     Current Outpatient Medications  Medication Sig Dispense Refill   aspirin EC 325 MG tablet Take 650 mg by mouth daily as needed for moderate pain.     Aspirin-Salicylamide-Caffeine (BC HEADACHE PO) Take 1 packet by mouth daily as needed (headaches).     aspirin-sod bicarb-citric acid (ALKA-SELTZER) 325 MG TBEF tablet Take 650 mg by mouth daily as needed (indigestion).     BENZOYL PEROXIDE EX Apply 1 application  topically daily as needed (  acne).     CRANBERRY PO Take 1 tablet by mouth daily as needed (urinary pain).     Multiple Vitamin (MULTIVITAMIN WITH MINERALS) TABS tablet Take 1 tablet by mouth 3 (three) times a week.     nicotine polacrilex (NICORETTE) 4 MG gum Take 4 mg by mouth as needed for smoking cessation.     Omega-3 Fatty Acids (FISH OIL PO) Take 1 capsule by mouth 3 (three) times a week.     Saw Palmetto, Serenoa repens, (SAW PALMETTO PO) Take 2 capsules by mouth daily as needed (prostate enlarged).     No current facility-administered medications for this visit.     Review of Systems Full ROS  was asked and was negative except for the information on the  HPI  Physical Exam Blood pressure (!) 156/99, pulse 73, temperature 98.1 F (36.7 C), temperature source Oral, height '6\' 1"'$  (1.854 m), weight 183 lb (83 kg), SpO2 99 %. CONSTITUTIONAL: NAD. EYES: Pupils are equal, round, Sclera are non-icteric. EARS, NOSE, MOUTH AND THROAT: The oropharynx is clear. The oral mucosa is pink and moist. Hearing is intact to voice. LYMPH NODES:  Lymph nodes in the neck are normal. RESPIRATORY:  Lungs are clear. There is normal respiratory effort, with equal breath sounds bilaterally, and without pathologic use of accessory muscles. CARDIOVASCULAR: Heart is regular without murmurs, gallops, or rubs. GI: The abdomen is  soft, nontender, and nondistended. There are no palpable masses. There is no hepatosplenomegaly. There are normal bowel sounds in all quadrants. GU: Rectal deferred.   MUSCULOSKELETAL: Normal muscle strength and tone. No cyanosis or edema.   SKIN: Turgor is good and there are no pathologic skin lesions or ulcers. NEUROLOGIC: Motor and sensation is grossly normal. Cranial nerves are grossly intact. PSYCH:  Oriented to person, place and time. Affect is normal.  Data Reviewed  I have personally reviewed the patient's imaging, laboratory findings and medical records.    Assessment/Plan 62 year old male with his second recurrence of his Right inguinal hernia.  Discussed with him In detail about his disease process.  Do think that since he had bilateral open inguinal hernia with mesh and a prior laparoscopic left inguinal hernia repair with mesh we need to delineate the anatomy with a CT scan.  I do think that eventually he will need robotic repair of the right inguinal hernia possibly the left.  I had an extensive discussion with the patient about the rationale to do preoperative planning with a CT scan. He is very understanding Also had an extensive discussion regarding repair robotically.  This is obviously hernia repair which carries more risk and is  associated with increased pain. Copy of this report was sent to the referring provider.  Please note that I spent 55 minutes in this encounter including personally reviewing extensive medical records, coordinating his care, counseling the patient, placing orders and performing appropriate documentation   Caroleen Hamman, MD [facility] 12/25/2022, 5:05 PM

## 2022-12-26 ENCOUNTER — Ambulatory Visit
Admission: RE | Admit: 2022-12-26 | Discharge: 2022-12-26 | Disposition: A | Discharge status: Home / Self Care | Payer: 59 | Source: Ambulatory Visit | Attending: Surgery | Admitting: Surgery

## 2022-12-26 DIAGNOSIS — Z9889 Other specified postprocedural states: Secondary | ICD-10-CM | POA: Diagnosis not present

## 2022-12-26 DIAGNOSIS — K4090 Unilateral inguinal hernia, without obstruction or gangrene, not specified as recurrent: Secondary | ICD-10-CM | POA: Diagnosis not present

## 2022-12-26 DIAGNOSIS — N433 Hydrocele, unspecified: Secondary | ICD-10-CM | POA: Diagnosis not present

## 2022-12-26 DIAGNOSIS — K4020 Bilateral inguinal hernia, without obstruction or gangrene, not specified as recurrent: Secondary | ICD-10-CM | POA: Diagnosis not present

## 2022-12-26 MED ORDER — IOHEXOL 300 MG/ML  SOLN: 100.0000 mL | Freq: Once | INTRAMUSCULAR | Status: DC | PRN

## 2022-12-26 MED ORDER — IOHEXOL 300 MG/ML  SOLN
85.0000 mL | Freq: Once | INTRAMUSCULAR | Status: AC | PRN
  Administered 2022-12-26: 85 mL via INTRAVENOUS

## 2022-12-29 ENCOUNTER — Telehealth: Payer: Self-pay

## 2022-12-29 NOTE — Telephone Encounter (Signed)
Patient notified of results of CT scan. Will follow up as scheduled.

## 2022-12-29 NOTE — Telephone Encounter (Signed)
-----  Message from Jules Husbands, MD sent at 12/28/2022  8:56 PM EST ----- Please let him know CT does  ct does not show a left hernia, clinincally has right inguinal hernia ----- Message ----- From: Interface, Rad Results In Sent: 12/28/2022  11:49 AM EST To: Jules Husbands, MD

## 2022-12-31 ENCOUNTER — Telehealth: Payer: Self-pay | Admitting: Surgery

## 2022-12-31 ENCOUNTER — Ambulatory Visit: Payer: 59 | Admitting: Surgery

## 2022-12-31 ENCOUNTER — Encounter: Payer: Self-pay | Admitting: Surgery

## 2022-12-31 VITALS — BP 158/99 | HR 72 | Temp 97.7°F | Ht 73.0 in | Wt 187.2 lb

## 2022-12-31 DIAGNOSIS — K4091 Unilateral inguinal hernia, without obstruction or gangrene, recurrent: Secondary | ICD-10-CM

## 2022-12-31 DIAGNOSIS — N433 Hydrocele, unspecified: Secondary | ICD-10-CM

## 2022-12-31 DIAGNOSIS — K4020 Bilateral inguinal hernia, without obstruction or gangrene, not specified as recurrent: Secondary | ICD-10-CM

## 2022-12-31 NOTE — Telephone Encounter (Signed)
   Pre-Admission date/time, and Surgery date at [facility].  Surgery Date: 01/29/23 Preadmission Testing Date: 01/23/23 (phone 8a-1p)  Patient has been made aware to call [phone], between 1-3:00pm the day before surgery, to find out what time to arrive for surgery.  '

## 2022-12-31 NOTE — Patient Instructions (Signed)
Our surgery scheduler Barbara will call you within 24-48 hours to get you scheduled. If you have not heard from her after 48 hours, please call our office. Have the blue sheet available when she calls to write down important information.   If you have any concerns or questions, please feel free to call our office.   Laparoscopic Inguinal Hernia Repair, Adult Laparoscopic inguinal hernia repair is a surgical procedure to repair a small, weak spot in the groin muscles that allows fat or intestines from inside the abdomen to bulge out (inguinal hernia). This procedure may be planned, or it may be an emergency procedure. During the procedure, tissue that has bulged out is moved back into place, and the opening in the groin muscles is repaired. This is done through three small incisions in the abdomen. A thin tube with a light and camera on the end (laparoscope) is used to help perform the procedure. Tell a health care provider about: Any allergies you have. All medicines you are taking, including vitamins, herbs, eye drops, creams, and over-the-counter medicines. Any problems you or family members have had with anesthetic medicines. Any blood disorders you have. Any surgeries you have had. Any medical conditions you have. Whether you are pregnant or may be pregnant. What are the risks? Generally, this is a safe procedure. However, problems may occur, including: Infection. Bleeding. Allergic reactions to medicines. Damage to nearby structures or organs. Testicle damage or long-term pain and swelling of the scrotum, in males. Inability to completely empty the bladder (urinary retention). Blood clots. A collection of fluid that builds up under the skin (seroma). The hernia coming back (recurrence). What happens before the procedure? Staying hydrated Follow instructions from your health care provider about hydration, which may include: Up to 2 hours before the procedure - you may continue to  drink clear liquids, such as water, clear fruit juice, black coffee, and plain tea.  Eating and drinking restrictions Follow instructions from your health care provider about eating and drinking, which may include: 8 hours before the procedure - stop eating heavy meals or foods, such as meat, fried foods, or fatty foods. 6 hours before the procedure - stop eating light meals or foods, such as toast or cereal. 6 hours before the procedure - stop drinking milk or drinks that contain milk. 2 hours before the procedure - stop drinking clear liquids. Medicines Ask your health care provider about: Changing or stopping your regular medicines. This is especially important if you are taking diabetes medicines or blood thinners. Taking medicines such as aspirin and ibuprofen. These medicines can thin your blood. Do not take these medicines unless your health care provider tells you to take them. Taking over-the-counter medicines, vitamins, herbs, and supplements. General instructions Do not use any products that contain nicotine or tobacco for at least 4 weeks before the procedure, if possible. These products include cigarettes, chewing tobacco, and vaping devices, such as e-cigarettes. If you need help quitting, ask your health care provider. Ask your health care provider: How your surgery site will be marked. What steps will be taken to help prevent infection. These steps may include: Removing hair at the surgery site. Washing skin with a germ-killing soap. Taking antibiotic medicine. Plan to have a responsible adult take you home from the hospital or clinic. Plan to have a responsible adult care for you for the time you are told after you leave the hospital or clinic. This is important. What happens during the procedure? An IV will   be inserted into one of your veins. You will be given one or more of the following: A medicine to help you relax (sedative). A medicine to make you fall asleep  (general anesthetic). Three small incisions will be made in your abdomen. Your abdomen will be inflated with carbon dioxide gas to make the surgical area easier to see. A laparoscope and surgical instruments will be inserted through the incisions. The laparoscope will send images of the inside of your abdomen to a monitor in the room. Tissue that is bulging through the hernia may be removed or moved back into place. The hernia opening will be closed with a sheet of surgical mesh. The surgical instruments and laparoscope will be removed. Your incisions will be closed with stitches (sutures) and adhesive strips. A bandage (dressing) will be placed over your incisions. The procedure may vary among health care providers and hospitals. What happens after the procedure? Your blood pressure, heart rate, breathing rate, and blood oxygen level will be monitored until you leave the hospital or clinic. You will be given pain medicine as needed. You may continue to receive medicines and fluids through an IV. The IV will be removed after you can drink fluids. You will be encouraged to get up and move around and to take deep breaths frequently. If you were given a sedative during the procedure, it can affect you for several hours. Do not drive or operate machinery until your health care provider says that it is safe. Summary Laparoscopic inguinal hernia repair is a surgical procedure to repair a small, weak spot in the groin muscles that allows fat or intestines from inside the abdomen to bulge out (inguinal hernia). This procedure is done through three small incisions in the abdomen. A thin tube with a light and camera on the end (laparoscope) is used to help perform the procedure. After the procedure, you will be encouraged to get up and move around and to take deep breaths frequently. This information is not intended to replace advice given to you by your health care provider. Make sure you discuss any  questions you have with your health care provider. Document Revised: 07/31/2020 Document Reviewed: 07/31/2020 Elsevier Patient Education  2023 Elsevier Inc.  

## 2023-01-04 NOTE — H&P (View-Only) (Signed)
Surgical Consultation  01/04/2023  Joel Ramirez is an 62 y.o. male.   Chief Complaint  Patient presents with   Follow-up    Inguinal hernia     HPI: Joel Ramirez is a 62 y.o. male patient in consultation at the request of Dr.Babaoff .  He comes in with a bulge to the right inguinal area.  Of note he has had initial open bilateral inguinal hernia repair by Dr. Tamala Julian in 2017.  This repair was done with mesh.  Subsequently the patient had a recurrence on the left side and Dr. Tamala Julian did a laparoscopic TAP Left  repair with mesh  BARD a slit was cut 2018.  A few months ago, he noticed a bulge on the right side that creates some intermittent pain that is mild to moderate and sharp.  Pain seems to be worsening with Valsalva.  No fevers, no chills. I have personally review both operative reports in detail  Past Medical History:  Diagnosis Date   Actinic keratosis    Dysplastic nevus 05/28/2020   Right mid medial thigh. Severe atypia, peripheral margin involved.   GERD (gastroesophageal reflux disease)    Hemorrhoids    Inguinal hernia    Substance abuse [facility])     Past Surgical History:  Procedure Laterality Date   COLONOSCOPY WITH PROPOFOL N/A 06/29/2015   Procedure: COLONOSCOPY WITH PROPOFOL;  Surgeon: Josefine Class, MD;  Location: [facility] ENDOSCOPY;  Service: Endoscopy;  Laterality: N/A;   INGUINAL HERNIA REPAIR Bilateral 02/12/2016   Procedure: HERNIA REPAIR INGUINAL ADULT BILATERAL;  Surgeon: Leonie Green, MD;  Location: [facility] ORS;  Service: General;  Laterality: Bilateral;   INGUINAL HERNIA REPAIR Left 10/08/2017   Procedure: LAPAROSCOPIC INGUINAL HERNIA WITH MESH;  Surgeon: Leonie Green, MD;  Location: [facility] ORS;  Service: General;  Laterality: Left;   KNEE ARTHROSCOPY Left     History reviewed. No pertinent family history.  Social History:  reports that he quit smoking about 19 years ago. His smoking use included cigarettes. He has a 20.00 pack-year smoking  history. He has never used smokeless tobacco. He reports that he does not drink alcohol and does not use drugs.  Allergies:  Allergies  Allergen Reactions   Penicillins Rash    LAST HAD AS A CHILD AT AGE 77 Has patient had a PCN reaction causing immediate rash, facial/tongue/throat swelling, SOB or lightheadedness with hypotension: Yes Has patient had a PCN reaction causing severe rash involving mucus membranes or skin necrosis: No Has patient had a PCN reaction that required hospitalization: Unknown Has patient had a PCN reaction occurring within the last 10 years: No If all of the above answers are "NO", then may proceed with Cephalosporin use.     Medications reviewed.     ROS Full ROS performed and is otherwise negative other than what is stated in the HPI    BP (!) 158/99   Pulse 72   Temp 97.7 F (36.5 C) (Oral)   Ht 6' 1"$  (1.854 m)   Wt 187 lb 3.2 oz (84.9 kg)   SpO2 97%   BMI 24.70 kg/m   Physical Exam  CONSTITUTIONAL: NAD. EYES: Pupils are equal, round, Sclera are non-icteric. EARS, NOSE, MOUTH AND THROAT: The oropharynx is clear. The oral mucosa is pink and moist. Hearing is intact to voice. LYMPH NODES:  Lymph nodes in the neck are normal. RESPIRATORY:  Lungs are clear. There is normal respiratory effort, with equal breath sounds bilaterally, and without pathologic use of  accessory muscles. CARDIOVASCULAR: Heart is regular without murmurs, gallops, or rubs. GI: The abdomen is  soft, nontender, and nondistended. There are no palpable masses. There is no hepatosplenomegaly. There are normal bowel sounds in all quadrants. Reducible Right inguinal hernia GU: Rectal deferred.   MUSCULOSKELETAL: Normal muscle strength and tone. No cyanosis or edema.   SKIN: Turgor is good and there are no pathologic skin lesions or ulcers. NEUROLOGIC: Motor and sensation is grossly normal. Cranial nerves are grossly intact. PSYCH:  Oriented to person, place and time. Affect is  normal.  Assessment/Plan: Recurrent Right inguinal . I  Discussed with him In detail about his disease process. I Do think that since he had bilateral open inguinal hernia with mesh and a prior laparoscopic left inguinal hernia repair best approach in my hands would be robotic.  I do think that he will need robotic repair of the right inguinal hernia possibly the left.  I had an extensive discussion with the patient about the rationale , risks, benefits and possible complications including recurrence. Bleeding, infection,He is very understanding Also had an extensive discussion regarding repair robotically.  This is obviously a recurrent hernia which carries more risk and is associated with increased pain. Please note that I spent 45 minutes in this encounter including personally reviewing extensive medical records, coordinating his care, counseling the patient, placing orders and performing appropriate documentation   Caroleen Hamman, MD [facility]

## 2023-01-04 NOTE — Progress Notes (Signed)
Surgical Consultation  01/04/2023  Joel Ramirez is an 61 y.o. male.   Chief Complaint  Patient presents with   Follow-up    Inguinal hernia     HPI: Joel Ramirez is a 61 y.o. male patient in consultation at the request of Dr.Babaoff .  He comes in with a bulge to the right inguinal area.  Of note he has had initial open bilateral inguinal hernia repair by Dr. Smith in 2017.  This repair was done with mesh.  Subsequently the patient had a recurrence on the left side and Dr. Smith did a laparoscopic TAP Left  repair with mesh  BARD a slit was cut 2018.  A few months ago, he noticed a bulge on the right side that creates some intermittent pain that is mild to moderate and sharp.  Pain seems to be worsening with Valsalva.  No fevers, no chills. I have personally review both operative reports in detail  Past Medical History:  Diagnosis Date   Actinic keratosis    Dysplastic nevus 05/28/2020   Right mid medial thigh. Severe atypia, peripheral margin involved.   GERD (gastroesophageal reflux disease)    Hemorrhoids    Inguinal hernia    Substance abuse (HCC)     Past Surgical History:  Procedure Laterality Date   COLONOSCOPY WITH PROPOFOL N/A 06/29/2015   Procedure: COLONOSCOPY WITH PROPOFOL;  Surgeon: Matthew Gordon Rein, MD;  Location: [facility] ENDOSCOPY;  Service: Endoscopy;  Laterality: N/A;   INGUINAL HERNIA REPAIR Bilateral 02/12/2016   Procedure: HERNIA REPAIR INGUINAL ADULT BILATERAL;  Surgeon: Jarvis Wilton Smith, MD;  Location: [facility] ORS;  Service: General;  Laterality: Bilateral;   INGUINAL HERNIA REPAIR Left 10/08/2017   Procedure: LAPAROSCOPIC INGUINAL HERNIA WITH MESH;  Surgeon: Smith, Jarvis Wilton, MD;  Location: [facility] ORS;  Service: General;  Laterality: Left;   KNEE ARTHROSCOPY Left     History reviewed. No pertinent family history.  Social History:  reports that he quit smoking about 19 years ago. His smoking use included cigarettes. He has a 20.00 pack-year smoking  history. He has never used smokeless tobacco. He reports that he does not drink alcohol and does not use drugs.  Allergies:  Allergies  Allergen Reactions   Penicillins Rash    LAST HAD AS A CHILD AT AGE 5 Has patient had a PCN reaction causing immediate rash, facial/tongue/throat swelling, SOB or lightheadedness with hypotension: Yes Has patient had a PCN reaction causing severe rash involving mucus membranes or skin necrosis: No Has patient had a PCN reaction that required hospitalization: Unknown Has patient had a PCN reaction occurring within the last 10 years: No If all of the above answers are "NO", then may proceed with Cephalosporin use.     Medications reviewed.     ROS Full ROS performed and is otherwise negative other than what is stated in the HPI    BP (!) 158/99   Pulse 72   Temp 97.7 F (36.5 C) (Oral)   Ht 6' 1" (1.854 m)   Wt 187 lb 3.2 oz (84.9 kg)   SpO2 97%   BMI 24.70 kg/m   Physical Exam  CONSTITUTIONAL: NAD. EYES: Pupils are equal, round, Sclera are non-icteric. EARS, NOSE, MOUTH AND THROAT: The oropharynx is clear. The oral mucosa is pink and moist. Hearing is intact to voice. LYMPH NODES:  Lymph nodes in the neck are normal. RESPIRATORY:  Lungs are clear. There is normal respiratory effort, with equal breath sounds bilaterally, and without pathologic use of   accessory muscles. CARDIOVASCULAR: Heart is regular without murmurs, gallops, or rubs. GI: The abdomen is  soft, nontender, and nondistended. There are no palpable masses. There is no hepatosplenomegaly. There are normal bowel sounds in all quadrants. Reducible Right inguinal hernia GU: Rectal deferred.   MUSCULOSKELETAL: Normal muscle strength and tone. No cyanosis or edema.   SKIN: Turgor is good and there are no pathologic skin lesions or ulcers. NEUROLOGIC: Motor and sensation is grossly normal. Cranial nerves are grossly intact. PSYCH:  Oriented to person, place and time. Affect is  normal.  Assessment/Plan: Recurrent Right inguinal . I  Discussed with him In detail about his disease process. I Do think that since he had bilateral open inguinal hernia with mesh and a prior laparoscopic left inguinal hernia repair best approach in my hands would be robotic.  I do think that he will need robotic repair of the right inguinal hernia possibly the left.  I had an extensive discussion with the patient about the rationale , risks, benefits and possible complications including recurrence. Bleeding, infection,He is very understanding Also had an extensive discussion regarding repair robotically.  This is obviously a recurrent hernia which carries more risk and is associated with increased pain. Please note that I spent 45 minutes in this encounter including personally reviewing extensive medical records, coordinating his care, counseling the patient, placing orders and performing appropriate documentation   Marsela Kuan, MD FACS General Surgeon  

## 2023-01-23 ENCOUNTER — Encounter
Admission: RE | Admit: 2023-01-23 | Discharge: 2023-01-23 | Disposition: A | Discharge status: Home / Self Care | Payer: 59 | Source: Ambulatory Visit | Attending: Surgery | Admitting: Surgery

## 2023-01-23 DIAGNOSIS — D649 Anemia, unspecified: Secondary | ICD-10-CM

## 2023-01-23 DIAGNOSIS — Z01812 Encounter for preprocedural laboratory examination: Secondary | ICD-10-CM

## 2023-01-23 HISTORY — DX: Alcohol abuse, in remission: F10.11

## 2023-01-23 HISTORY — DX: Anemia, unspecified: D64.9

## 2023-01-23 NOTE — Patient Instructions (Addendum)
Your procedure is scheduled on:01-29-23 Thursday Report to the Registration Desk on the 1st floor of the [place].Then proceed to the 2nd floor Surgery Desk To find out your arrival time, please call [phone] between 1PM - 3PM on:01-28-23 Wednesday If your arrival time is 6:00 am, do not arrive before that time as the [place] entrance doors do not open until 6:00 am.  REMEMBER: Instructions that are not followed completely may result in serious medical risk, up to and including death; or upon the discretion of your surgeon and anesthesiologist your surgery may need to be rescheduled.  Do not eat food after midnight the night before surgery.  No gum chewing or hard candies.  You may however, drink CLEAR liquids up to 2 hours before you are scheduled to arrive for your surgery. Do not drink anything within 2 hours of your scheduled arrival time.  Clear liquids include: - water  - apple juice without pulp - gatorade (not RED colors) - black coffee or tea (Do NOT add milk or creamers to the coffee or tea) Do NOT drink anything that is not on this list.  One week prior to surgery: Stop Anti-inflammatories (NSAIDS) such as Advil, Aleve, Ibuprofen, Motrin, Naproxen, Naprosyn and Aspirin based products such as Excedrin, Goody's Powder, BC Powder, Alka Seltzer.You may however, take Tylenol if needed for pain up until the day of surgery.  Stop ANY OVER THE COUNTER supplements/vitamins NOW (01-23-23) until after surgery (saw [facility])  Continue taking all prescribed medications with the exception of the following:Aspirin, Alka Seltzer and Saw Palmetto  Do NOT take any medication the day of surgery  No Alcohol for 24 hours before or after surgery.  No Smoking including e-cigarettes for 24 hours before surgery.  No chewable tobacco products for at least 6 hours before surgery.  No nicotine patches on the day of surgery.  Do not use any "recreational" drugs for at least a week  (preferably 2 weeks) before your surgery.  Please be advised that the combination of cocaine and anesthesia may have negative outcomes, up to and including death. If you test positive for cocaine, your surgery will be cancelled.  On the morning of surgery brush your teeth with toothpaste and water, you may rinse your mouth with mouthwash if you wish. Do not swallow any toothpaste or mouthwash.  Use CHG Soap as directed on instruction sheet.  Do not wear jewelry, make-up, hairpins, clips or nail polish.  Do not wear lotions, powders, or perfumes.   Do not shave body hair from the neck down 48 hours before surgery.  Contact lenses, hearing aids and dentures may not be worn into surgery.  Do not bring valuables to the hospital. [facility] is not responsible for any missing/lost belongings or valuables.   Notify your doctor if there is any change in your medical condition (cold, fever, infection).  Wear comfortable clothing (specific to your surgery type) to the hospital.  After surgery, you can help prevent lung complications by doing breathing exercises.  Take deep breaths and cough every 1-2 hours. Your doctor may order a device called an Incentive Spirometer to help you take deep breaths. When coughing or sneezing, hold a pillow firmly against your incision with both hands. This is called "splinting." Doing this helps protect your incision. It also decreases belly discomfort.  If you are being admitted to the hospital overnight, leave your suitcase in the car. After surgery it may be brought to your room.  In case  of increased patient census, it may be necessary for you, the patient, to continue your postoperative care in the Same Day Surgery department.  If you are being discharged the day of surgery, you will not be allowed to drive home. You will need a responsible individual to drive you home and stay with you for 24 hours after surgery.   If you are taking public  transportation, you will need to have a responsible individual with you.  Please call the [place] Dept. at [phone] if you have any questions about these instructions.  Surgery Visitation Policy:  Patients undergoing a surgery or procedure may have two family members or support persons with them as long as the person is not COVID-19 positive or experiencing its symptoms.   Due to an increase in RSV and influenza rates and associated hospitalizations, children ages 78 and under will not be able to visit patients in [facility]. Masks continue to be strongly recommended.      Preparing for Surgery with CHLORHEXIDINE GLUCONATE (CHG) Soap  Chlorhexidine Gluconate (CHG) Soap  o An antiseptic cleaner that kills germs and bonds with the skin to continue killing germs even after washing  o Used for showering the night before surgery and morning of surgery  Before surgery, you can play an important role by reducing the number of germs on your skin.  CHG (Chlorhexidine gluconate) soap is an antiseptic cleanser which kills germs and bonds with the skin to continue killing germs even after washing.  Please do not use if you have an allergy to CHG or antibacterial soaps. If your skin becomes reddened/irritated stop using the CHG.  1. Shower the NIGHT BEFORE SURGERY and the MORNING OF SURGERY with CHG soap.  2. If you choose to wash your hair, wash your hair first as usual with your normal shampoo.  3. After shampooing, rinse your hair and body thoroughly to remove the shampoo.  4. Use CHG as you would any other liquid soap. You can apply CHG directly to the skin and wash gently with a scrungie or a clean washcloth.  5. Apply the CHG soap to your body only from the neck down. Do not use on open wounds or open sores. Avoid contact with your eyes, ears, mouth, and genitals (private parts). Wash face and genitals (private parts) with your normal soap.  6. Wash  thoroughly, paying special attention to the area where your surgery will be performed.  7. Thoroughly rinse your body with warm water.  8. Do not shower/wash with your normal soap after using and rinsing off the CHG soap.  9. Pat yourself dry with a clean towel.  10. Wear clean pajamas to bed the night before surgery.  12. Place clean sheets on your bed the night of your first shower and do not sleep with pets.  13. Shower again with the CHG soap on the day of surgery prior to arriving at the hospital.  14. Do not apply any deodorants/lotions/powders.  15. Please wear clean clothes to the hospital.   Preparing for Surgery with CHLORHEXIDINE GLUCONATE (CHG) Soap  Chlorhexidine Gluconate (CHG) Soap  o An antiseptic cleaner that kills germs and bonds with the skin to continue killing germs even after washing  o Used for showering the night before surgery and morning of surgery  Before surgery, you can play an important role by reducing the number of germs on your skin.  CHG (Chlorhexidine gluconate) soap is an antiseptic cleanser which kills  germs and bonds with the skin to continue killing germs even after washing.  Please do not use if you have an allergy to CHG or antibacterial soaps. If your skin becomes reddened/irritated stop using the CHG.  1. Shower the NIGHT BEFORE SURGERY and the MORNING OF SURGERY with CHG soap.  2. If you choose to wash your hair, wash your hair first as usual with your normal shampoo.  3. After shampooing, rinse your hair and body thoroughly to remove the shampoo.  4. Use CHG as you would any other liquid soap. You can apply CHG directly to the skin and wash gently with a scrungie or a clean washcloth.  5. Apply the CHG soap to your body only from the neck down. Do not use on open wounds or open sores. Avoid contact with your eyes, ears, mouth, and genitals (private parts). Wash face and genitals (private parts) with your normal soap.  6. Wash  thoroughly, paying special attention to the area where your surgery will be performed.  7. Thoroughly rinse your body with warm water.  8. Do not shower/wash with your normal soap after using and rinsing off the CHG soap.  9. Pat yourself dry with a clean towel.  10. Wear clean pajamas to bed the night before surgery.  12. Place clean sheets on your bed the night of your first shower and do not sleep with pets.  13. Shower again with the CHG soap on the day of surgery prior to arriving at the hospital.  14. Do not apply any deodorants/lotions/powders.  15. Please wear clean clothes to the hospital.

## 2023-01-26 ENCOUNTER — Encounter
Admission: RE | Admit: 2023-01-26 | Discharge: 2023-01-26 | Disposition: A | Discharge status: Home / Self Care | Payer: 59 | Source: Ambulatory Visit | Attending: Surgery | Admitting: Surgery

## 2023-01-26 DIAGNOSIS — Z01812 Encounter for preprocedural laboratory examination: Secondary | ICD-10-CM | POA: Insufficient documentation

## 2023-01-26 DIAGNOSIS — D649 Anemia, unspecified: Secondary | ICD-10-CM | POA: Diagnosis not present

## 2023-01-26 LAB — CBC
HCT: 42.2 % (ref 39.0–52.0)
Hemoglobin: 13.8 g/dL (ref 13.0–17.0)
MCH: 29.3 pg (ref 26.0–34.0)
MCHC: 32.7 g/dL (ref 30.0–36.0)
MCV: 89.6 fL (ref 80.0–100.0)
Platelets: 189 10*3/uL (ref 150–400)
RBC: 4.71 MIL/uL (ref 4.22–5.81)
RDW: 13.2 % (ref 11.5–15.5)
WBC: 4.4 10*3/uL (ref 4.0–10.5)
nRBC: 0 % (ref 0.0–0.2)

## 2023-01-26 LAB — BASIC METABOLIC PANEL
Anion gap: 7 (ref 5–15)
BUN: 21 mg/dL — ABNORMAL HIGH (ref 6–20)
CO2: 28 mmol/L (ref 22–32)
Calcium: 9.3 mg/dL (ref 8.9–10.3)
Chloride: 104 mmol/L (ref 98–111)
Creatinine, Ser: 0.94 mg/dL (ref 0.61–1.24)
GFR, Estimated: >60 mL/min (ref >60)
Glucose, Bld: 123 mg/dL — ABNORMAL HIGH (ref 70–99)
Potassium: 4.2 mmol/L (ref 3.5–5.1)
Sodium: 139 mmol/L (ref 135–145)

## 2023-01-28 MED ORDER — CELECOXIB 200 MG PO CAPS: 200.0000 mg | ORAL_CAPSULE | ORAL | Status: AC

## 2023-01-28 MED ORDER — CEFAZOLIN SODIUM-DEXTROSE 2-4 GM/100ML-% IV SOLN: 2.0000 g | INTRAVENOUS | Status: DC

## 2023-01-28 MED ORDER — GABAPENTIN 300 MG PO CAPS: 300.0000 mg | ORAL_CAPSULE | ORAL | Status: AC

## 2023-01-28 MED ORDER — CHLORHEXIDINE GLUCONATE CLOTH 2 % EX PADS: 6.0000 | MEDICATED_PAD | Freq: Once | CUTANEOUS | Status: DC

## 2023-01-28 MED ORDER — LACTATED RINGERS IV SOLN: INTRAVENOUS | Status: DC

## 2023-01-28 MED ORDER — ACETAMINOPHEN 500 MG PO TABS: 1000.0000 mg | ORAL_TABLET | ORAL | Status: AC

## 2023-01-28 MED ORDER — ORAL CARE MOUTH RINSE: 15.0000 mL | Freq: Once | OROMUCOSAL | Status: AC

## 2023-01-28 MED ORDER — FAMOTIDINE 20 MG PO TABS: 20.0000 mg | ORAL_TABLET | Freq: Once | ORAL | Status: AC

## 2023-01-28 MED ORDER — CHLORHEXIDINE GLUCONATE 0.12 % MT SOLN: 15.0000 mL | Freq: Once | OROMUCOSAL | Status: AC

## 2023-01-29 ENCOUNTER — Other Ambulatory Visit: Payer: Self-pay

## 2023-01-29 ENCOUNTER — Ambulatory Visit: Payer: 59 | Admitting: Certified Registered"

## 2023-01-29 ENCOUNTER — Ambulatory Visit: Payer: 59 | Admitting: Urgent Care

## 2023-01-29 ENCOUNTER — Ambulatory Visit
Admission: RE | Admit: 2023-01-29 | Discharge: 2023-01-29 | Disposition: A | Discharge status: Home / Self Care | Payer: 59 | Attending: Surgery | Admitting: Surgery

## 2023-01-29 ENCOUNTER — Encounter
Admission: RE | Disposition: A | Discharge status: Home / Self Care | Payer: Self-pay | Source: Home / Self Care | Attending: Surgery

## 2023-01-29 ENCOUNTER — Encounter: Payer: Self-pay | Admitting: Surgery

## 2023-01-29 DIAGNOSIS — Z87891 Personal history of nicotine dependence: Secondary | ICD-10-CM | POA: Diagnosis not present

## 2023-01-29 DIAGNOSIS — D759 Disease of blood and blood-forming organs, unspecified: Secondary | ICD-10-CM | POA: Insufficient documentation

## 2023-01-29 DIAGNOSIS — K219 Gastro-esophageal reflux disease without esophagitis: Secondary | ICD-10-CM | POA: Diagnosis not present

## 2023-01-29 DIAGNOSIS — D649 Anemia, unspecified: Secondary | ICD-10-CM | POA: Insufficient documentation

## 2023-01-29 DIAGNOSIS — K4091 Unilateral inguinal hernia, without obstruction or gangrene, recurrent: Secondary | ICD-10-CM | POA: Diagnosis not present

## 2023-01-29 DIAGNOSIS — K4090 Unilateral inguinal hernia, without obstruction or gangrene, not specified as recurrent: Secondary | ICD-10-CM | POA: Diagnosis not present

## 2023-01-29 SURGERY — HERNIORRHAPHY, INGUINAL, ROBOT-ASSISTED, LAPAROSCOPIC
Anesthesia: General | Laterality: Right

## 2023-01-29 MED ORDER — BUPIVACAINE HCL (PF) 0.25 % IJ SOLN
INTRAMUSCULAR | Status: AC
  Filled 2023-01-29: qty 30

## 2023-01-29 MED ORDER — MIDAZOLAM HCL 2 MG/2ML IJ SOLN
INTRAMUSCULAR | Status: AC
  Filled 2023-01-29: qty 2

## 2023-01-29 MED ORDER — DEXMEDETOMIDINE HCL IN NACL 200 MCG/50ML IV SOLN
INTRAVENOUS | Status: DC | PRN
  Administered 2023-01-29: 8 ug via INTRAVENOUS
  Administered 2023-01-29: 4 ug via INTRAVENOUS

## 2023-01-29 MED ORDER — ONDANSETRON HCL 4 MG/2ML IJ SOLN
INTRAMUSCULAR | Status: DC | PRN
  Administered 2023-01-29: 4 mg via INTRAVENOUS

## 2023-01-29 MED ORDER — CEFAZOLIN SODIUM-DEXTROSE 2-4 GM/100ML-% IV SOLN
INTRAVENOUS | Status: AC
  Filled 2023-01-29: qty 100

## 2023-01-29 MED ORDER — LACTATED RINGERS IV SOLN: INTRAVENOUS | Status: DC | PRN

## 2023-01-29 MED ORDER — ACETAMINOPHEN 500 MG PO TABS
ORAL_TABLET | ORAL | Status: AC
  Administered 2023-01-29: 1000 mg via ORAL
  Filled 2023-01-29: qty 2

## 2023-01-29 MED ORDER — ROCURONIUM BROMIDE 100 MG/10ML IV SOLN
INTRAVENOUS | Status: DC | PRN
  Administered 2023-01-29: 30 mg via INTRAVENOUS
  Administered 2023-01-29: 40 mg via INTRAVENOUS

## 2023-01-29 MED ORDER — ONDANSETRON HCL 4 MG/2ML IJ SOLN: 4.0000 mg | Freq: Once | INTRAMUSCULAR | Status: DC | PRN

## 2023-01-29 MED ORDER — EPINEPHRINE PF 1 MG/ML IJ SOLN
INTRAMUSCULAR | Status: AC
  Filled 2023-01-29: qty 1

## 2023-01-29 MED ORDER — SUGAMMADEX SODIUM 200 MG/2ML IV SOLN
INTRAVENOUS | Status: DC | PRN
  Administered 2023-01-29: 200 mg via INTRAVENOUS

## 2023-01-29 MED ORDER — FENTANYL CITRATE (PF) 100 MCG/2ML IJ SOLN
INTRAMUSCULAR | Status: AC
  Administered 2023-01-29: 25 ug via INTRAVENOUS
  Filled 2023-01-29: qty 2

## 2023-01-29 MED ORDER — EPHEDRINE SULFATE (PRESSORS) 50 MG/ML IJ SOLN
INTRAMUSCULAR | Status: DC | PRN
  Administered 2023-01-29: 10 mg via INTRAVENOUS

## 2023-01-29 MED ORDER — BUPIVACAINE LIPOSOME 1.3 % IJ SUSP
INTRAMUSCULAR | Status: AC
  Filled 2023-01-29: qty 20

## 2023-01-29 MED ORDER — GABAPENTIN 300 MG PO CAPS
ORAL_CAPSULE | ORAL | Status: AC
  Administered 2023-01-29: 300 mg via ORAL
  Filled 2023-01-29: qty 1

## 2023-01-29 MED ORDER — FENTANYL CITRATE (PF) 100 MCG/2ML IJ SOLN
25.0000 ug | INTRAMUSCULAR | Status: DC | PRN
  Administered 2023-01-29 (×5): 25 ug via INTRAVENOUS

## 2023-01-29 MED ORDER — FENTANYL CITRATE (PF) 100 MCG/2ML IJ SOLN
INTRAMUSCULAR | Status: DC | PRN
  Administered 2023-01-29 (×2): 50 ug via INTRAVENOUS

## 2023-01-29 MED ORDER — FENTANYL CITRATE (PF) 100 MCG/2ML IJ SOLN
INTRAMUSCULAR | Status: AC
  Filled 2023-01-29: qty 2

## 2023-01-29 MED ORDER — HYDROCODONE-ACETAMINOPHEN 5-325 MG PO TABS
ORAL_TABLET | ORAL | Status: AC
  Filled 2023-01-29: qty 1

## 2023-01-29 MED ORDER — CHLORHEXIDINE GLUCONATE 0.12 % MT SOLN
OROMUCOSAL | Status: AC
  Administered 2023-01-29: 15 mL via OROMUCOSAL
  Filled 2023-01-29: qty 15

## 2023-01-29 MED ORDER — LIDOCAINE HCL (CARDIAC) PF 100 MG/5ML IV SOSY
PREFILLED_SYRINGE | INTRAVENOUS | Status: DC | PRN
  Administered 2023-01-29: 60 mg via INTRAVENOUS

## 2023-01-29 MED ORDER — HYDROMORPHONE HCL 1 MG/ML IJ SOLN
INTRAMUSCULAR | Status: DC | PRN
  Administered 2023-01-29 (×2): .5 mg via INTRAVENOUS

## 2023-01-29 MED ORDER — DEXAMETHASONE SODIUM PHOSPHATE 10 MG/ML IJ SOLN
INTRAMUSCULAR | Status: DC | PRN
  Administered 2023-01-29: 10 mg via INTRAVENOUS

## 2023-01-29 MED ORDER — PHENYLEPHRINE HCL (PRESSORS) 10 MG/ML IV SOLN
INTRAVENOUS | Status: DC | PRN
  Administered 2023-01-29: 80 ug via INTRAVENOUS
  Administered 2023-01-29: 160 ug via INTRAVENOUS

## 2023-01-29 MED ORDER — MIDAZOLAM HCL 2 MG/2ML IJ SOLN
INTRAMUSCULAR | Status: DC | PRN
  Administered 2023-01-29: 2 mg via INTRAVENOUS

## 2023-01-29 MED ORDER — HYDROCODONE-ACETAMINOPHEN 5-325 MG PO TABS: 1.0000 | ORAL_TABLET | ORAL | 0 refills | Status: DC | PRN

## 2023-01-29 MED ORDER — HYDROMORPHONE HCL 1 MG/ML IJ SOLN
INTRAMUSCULAR | Status: AC
  Filled 2023-01-29: qty 1

## 2023-01-29 MED ORDER — BUPIVACAINE-EPINEPHRINE 0.25% -1:200000 IJ SOLN
INTRAMUSCULAR | Status: DC | PRN
  Administered 2023-01-29: 50 mL via INTRAMUSCULAR

## 2023-01-29 MED ORDER — HYDROCODONE-ACETAMINOPHEN 5-325 MG PO TABS
1.0000 | ORAL_TABLET | ORAL | Status: DC | PRN
  Administered 2023-01-29: 1 via ORAL

## 2023-01-29 MED ORDER — GLYCOPYRROLATE 0.2 MG/ML IJ SOLN
INTRAMUSCULAR | Status: DC | PRN
  Administered 2023-01-29: .2 mg via INTRAVENOUS

## 2023-01-29 MED ORDER — FAMOTIDINE 20 MG PO TABS
ORAL_TABLET | ORAL | Status: AC
  Administered 2023-01-29: 20 mg via ORAL
  Filled 2023-01-29: qty 1

## 2023-01-29 MED ORDER — CEFAZOLIN SODIUM-DEXTROSE 2-3 GM-%(50ML) IV SOLR
INTRAVENOUS | Status: DC | PRN
  Administered 2023-01-29: 2 g via INTRAVENOUS

## 2023-01-29 MED ORDER — 0.9 % SODIUM CHLORIDE (POUR BTL) OPTIME
TOPICAL | Status: DC | PRN
  Administered 2023-01-29: 1000 mL

## 2023-01-29 MED ORDER — PROPOFOL 10 MG/ML IV BOLUS
INTRAVENOUS | Status: DC | PRN
  Administered 2023-01-29: 170 mg via INTRAVENOUS
  Administered 2023-01-29: 30 mg via INTRAVENOUS

## 2023-01-29 MED ORDER — CELECOXIB 200 MG PO CAPS
ORAL_CAPSULE | ORAL | Status: AC
  Administered 2023-01-29: 200 mg via ORAL
  Filled 2023-01-29: qty 1

## 2023-01-29 SURGICAL SUPPLY — 48 items
ADH SKN CLS APL DERMABOND .7 (GAUZE/BANDAGES/DRESSINGS) ×1
CANNULA REDUC XI 12-8 STAPL (CANNULA) ×1
CANNULA REDUCER 12-8 DVNC XI (CANNULA) ×1 IMPLANT
COVER TIP SHEARS 8 DVNC (MISCELLANEOUS) ×1 IMPLANT
COVER TIP SHEARS 8MM DA VINCI (MISCELLANEOUS) ×1
COVER WAND RF STERILE (DRAPES) ×1 IMPLANT
DERMABOND ADVANCED .7 DNX12 (GAUZE/BANDAGES/DRESSINGS) ×1 IMPLANT
DRAPE ARM DVNC X/XI (DISPOSABLE) ×3 IMPLANT
DRAPE COLUMN DVNC XI (DISPOSABLE) ×1 IMPLANT
DRAPE DA VINCI XI ARM (DISPOSABLE) ×3
DRAPE DA VINCI XI COLUMN (DISPOSABLE) ×1
ELECT CAUTERY BLADE 6.4 (BLADE) ×1 IMPLANT
ELECT REM PT RETURN 9FT ADLT (ELECTROSURGICAL) ×1
ELECTRODE REM PT RTRN 9FT ADLT (ELECTROSURGICAL) ×1 IMPLANT
GLOVE BIO SURGEON STRL SZ7 (GLOVE) ×4 IMPLANT
GOWN STRL REUS W/ TWL LRG LVL3 (GOWN DISPOSABLE) ×4 IMPLANT
GOWN STRL REUS W/TWL LRG LVL3 (GOWN DISPOSABLE) ×4
IRRIGATION STRYKERFLOW (MISCELLANEOUS) ×1 IMPLANT
IRRIGATOR STRYKERFLOW (MISCELLANEOUS) ×1
IV NS 1000ML (IV SOLUTION)
IV NS 1000ML BAXH (IV SOLUTION) IMPLANT
KIT PINK PAD W/HEAD ARE REST (MISCELLANEOUS) ×1
KIT PINK PAD W/HEAD ARM REST (MISCELLANEOUS) ×1 IMPLANT
LABEL OR SOLS (LABEL) ×1 IMPLANT
MANIFOLD NEPTUNE II (INSTRUMENTS) ×1 IMPLANT
MESH 3DMAX 5X7 RT XLRG (Mesh General) IMPLANT
NEEDLE HYPO 22GX1.5 SAFETY (NEEDLE) ×1 IMPLANT
OBTURATOR OPTICAL STANDARD 8MM (TROCAR) ×1
OBTURATOR OPTICAL STND 8 DVNC (TROCAR) ×1
OBTURATOR OPTICALSTD 8 DVNC (TROCAR) ×1 IMPLANT
PACK LAP CHOLECYSTECTOMY (MISCELLANEOUS) ×1 IMPLANT
PENCIL SMOKE EVACUATOR (MISCELLANEOUS) ×1 IMPLANT
SEAL CANN UNIV 5-8 DVNC XI (MISCELLANEOUS) ×2 IMPLANT
SEAL XI 5MM-8MM UNIVERSAL (MISCELLANEOUS) ×2
SET TUBE SMOKE EVAC HIGH FLOW (TUBING) ×1 IMPLANT
SOL ELECTROSURG ANTI STICK (MISCELLANEOUS) ×1
SOLUTION ELECTROSURG ANTI STCK (MISCELLANEOUS) ×1 IMPLANT
SPONGE T-LAP 18X18 ~~LOC~~+RFID (SPONGE) ×1 IMPLANT
STAPLER CANNULA SEAL DVNC XI (STAPLE) ×1 IMPLANT
STAPLER CANNULA SEAL XI (STAPLE) ×1
SUT MNCRL AB 4-0 PS2 18 (SUTURE) ×1 IMPLANT
SUT V-LOC 90 ABS 3-0 VLT  V-20 (SUTURE) ×2
SUT V-LOC 90 ABS 3-0 VLT V-20 (SUTURE) ×2 IMPLANT
SUT VICRYL 0 UR6 27IN ABS (SUTURE) ×2 IMPLANT
SYR 30ML LL (SYRINGE) ×1 IMPLANT
TAPE TRANSPORE STRL 2 31045 (GAUZE/BANDAGES/DRESSINGS) ×1 IMPLANT
TRAP FLUID SMOKE EVACUATOR (MISCELLANEOUS) ×1 IMPLANT
WATER STERILE IRR 500ML POUR (IV SOLUTION) ×1 IMPLANT

## 2023-01-29 NOTE — Interval H&P Note (Signed)
History and Physical Interval Note:  01/29/2023 10:29 AM  Joel Ramirez  has presented today for surgery, with the diagnosis of recurrent inguinal hernia.  The various methods of treatment have been discussed with the patient and family. After consideration of risks, benefits and other options for treatment, the patient has consented to  Procedure(s): XI ROBOTIC ASSISTED INGUINAL HERNIA, possible bilateral (Right) as a surgical intervention.  The patient's history has been reviewed, patient examined, no change in status, stable for surgery.  I have reviewed the patient's chart and labs.  Questions were answered to the patient's satisfaction.     [place]

## 2023-01-29 NOTE — Anesthesia Preprocedure Evaluation (Addendum)
Anesthesia Evaluation  Patient identified by MRN, date of birth, ID band Patient awake    Reviewed: Allergy & Precautions, NPO status , Patient's Chart, lab work & pertinent test results  Airway Mallampati: II  TM Distance: >3 FB Neck ROM: full    Dental  (+) Teeth Intact   Pulmonary neg pulmonary ROS, Patient abstained from smoking., former smoker   Pulmonary exam normal breath sounds clear to auscultation       Cardiovascular Exercise Tolerance: Good negative cardio ROS Normal cardiovascular exam Rhythm:Regular Rate:Normal     Neuro/Psych negative neurological ROS  negative psych ROS   GI/Hepatic negative GI ROS, Neg liver ROS,GERD  Medicated,,  Endo/Other  negative endocrine ROS    Renal/GU negative Renal ROS  negative genitourinary   Musculoskeletal   Abdominal Normal abdominal exam  (+)   Peds negative pediatric ROS (+)  Hematology negative hematology ROS (+) Blood dyscrasia, anemia   Anesthesia Other Findings Past Medical History: No date: Actinic keratosis No date: Anemia 05/28/2020: Dysplastic nevus     Comment:  Right mid medial thigh. Severe atypia, peripheral margin              involved. No date: GERD (gastroesophageal reflux disease)     Comment:  h/o No date: Hemorrhoids No date: History of alcohol abuse No date: Inguinal hernia  Past Surgical History: 06/29/2015: COLONOSCOPY WITH PROPOFOL; N/A     Comment:  Procedure: COLONOSCOPY WITH PROPOFOL;  Surgeon: Josefine Class, MD;  [facility];  Service:               Endoscopy;  Laterality: N/A; 02/12/2016: INGUINAL HERNIA REPAIR; Bilateral     Comment:  Procedure: HERNIA REPAIR INGUINAL ADULT BILATERAL;                Surgeon: Leonie Green, MD;  Location: [facility] ORS;                Service: General;  Laterality: Bilateral; 10/08/2017: INGUINAL HERNIA REPAIR; Left     Comment:  Procedure: LAPAROSCOPIC INGUINAL  HERNIA WITH MESH;                Surgeon: Leonie Green, MD;  Location: [facility] ORS;                Service: General;  Laterality: Left; No date: KNEE ARTHROSCOPY; Left  BMI    Body Mass Index: 24.69 kg/m      Reproductive/Obstetrics negative OB ROS                             Anesthesia Physical Anesthesia Plan  ASA: 2  Anesthesia Plan: General   Post-op Pain Management:    Induction: Intravenous  PONV Risk Score and Plan: Ondansetron, Dexamethasone, Midazolam and Treatment may vary due to age or medical condition  Airway Management Planned: Oral ETT  Additional Equipment:   Intra-op Plan:   Post-operative Plan: Extubation in OR  Informed Consent: I have reviewed the patients History and Physical, chart, labs and discussed the procedure including the risks, benefits and alternatives for the proposed anesthesia with the patient or authorized representative who has indicated his/her understanding and acceptance.     Dental Advisory Given  Plan Discussed with: CRNA and Surgeon  Anesthesia Plan Comments:        Anesthesia Quick Evaluation

## 2023-01-29 NOTE — Op Note (Addendum)
Robotic assisted Laparoscopic Transabdominal Right Inguinal Hernia Repair with 3 D Xlarge Mesh      Pre-operative Diagnosis:  Recurrent Right Inguinal Hernia   Post-operative Diagnosis: Same   Procedure: Robotic  Laparoscopic  repair of Right inguinal hernia   Surgeon: Caroleen Hamman, MD FACS   Anesthesia: Gen. with endotracheal tube   Findings:Right recurrent  inguinal hernia w recurrence medial to the epigastric vessels      Left inguinal regional prior mesh and multiple stainless steel tacks   Procedure Details  The patient was seen again in the Holding Room. The benefits, complications, treatment options, and expected outcomes were discussed with the patient. The risks of bleeding, infection, recurrence of symptoms, failure to resolve symptoms, recurrence of hernia, ischemic orchitis, chronic pain syndrome or neuroma, were discussed again. The likelihood of improving the patient's symptoms with return to their baseline status is good.  The patient and/or family concurred with the proposed plan, giving informed consent.  The patient was taken to Operating Room, identified  and the procedure verified as Laparoscopic Inguinal Hernia Repair. Laterality confirmed.  A Time Out was held and the above information confirmed.   Prior to the induction of general anesthesia, antibiotic prophylaxis was administered. VTE prophylaxis was in place. General endotracheal anesthesia was then administered and tolerated well. After the induction, the abdomen was prepped with Chloraprep and draped in the sterile fashion. The patient was positioned in the supine position.   Supraumbilical incision created and cut down technique used to enter the abdominal cavity. Fascia elevated and incised and hasson trochar placed. Pneumoperitoneum obtained w/o HD changes. No evidence of bowel injuries.  Two 8 mm placed under direct vision. The laparoscopy revealed recurrent Right direct defect. I inserted the needles and the  mesh. The robot was brought ot the table and docked in the standard fashion, no collision between arms was observed. Instruments were kept under direct view at all times. We started on the right side were a flap was created. The sac was reduced and dissected free from adjacent structures. We preserved the vas and the vessels.I was able to push down the peritoneum so we had a large pocket. Once dissection was completed a X- large 3D mesh was placed and secured with two interrupted vicryl attached to the pubic tubercle. There was great coverage of the direct, indirect and femoral spaces. The flap was closed with v lock suture.   Second look revealed no complications or injuries.    Once assuring that hemostasis was adequate the ports were removed and a figure-of-eight 0 Vicryl suture was placed at the fascial edges. 4-0 subcuticular Monocryl was used at all skin edges. Dermabond was placed.  Patient tolerated the procedure well. There were no complications. He was taken to the recovery room in stable condition.         Caroleen Hamman, MD, FACS

## 2023-01-29 NOTE — Discharge Instructions (Addendum)
Laparoscopic Inguinal Hernia Repair, Adult, Care After The following information offers guidance on how to care for yourself after your procedure. Your health care provider may also give you more specific instructions. If you have problems or questions, contact your health care provider. What can I expect after the procedure? After the procedure, it is common to have: Pain. Swelling and bruising around the incision area. Scrotal swelling, in males. Some fluid or blood draining from your incisions. Follow these instructions at home: Medicines Take over-the-counter and prescription medicines only as told by your health care provider. Ask your health care provider if the medicine prescribed to you: Requires you to avoid driving or using machinery. Can cause constipation. You may need to take these actions to prevent or treat constipation: Drink enough fluid to keep your urine pale yellow. Take over-the-counter or prescription medicines. Eat foods that are high in fiber, such as beans, whole grains, and fresh fruits and vegetables. Limit foods that are high in fat and processed sugars, such as fried or sweet foods. Incision care  Follow instructions from your health care provider about how to take care of your incisions. Make sure you: Wash your hands with soap and water for at least 20 seconds before and after you change your bandage (dressing). If soap and water are not available, use hand sanitizer. Change your dressing as told by your health care provider. Leave stitches (sutures), skin glue, or adhesive strips in place. These skin closures may need to stay in place for 2 weeks or longer. If adhesive strip edges start to loosen and curl up, you may trim the loose edges. Do not remove adhesive strips completely unless your health care provider tells you to do that. Check your incision area every day for signs of infection. Check for: More redness, swelling, or pain. More fluid or  blood. Warmth. Pus or a bad smell. Wear loose, soft clothing while your incisions heal. Managing pain and swelling  If directed, put ice on the painful or swollen areas. To do this: Put ice in a plastic bag. Place a towel between your skin and the bag. Leave the ice on for 20 minutes, 2-3 times a day. Remove the ice if your skin turns bright red. This is very important. If you cannot feel pain, heat, or cold, you have a greater risk of damage to the area.   Activity Do not lift anything that is heavier than 10 lb (4.5 kg), or the limit that you are told, until your health care provider says that it is safe. Ask your health care provider what activities are safe for you. A lot of activity during the first week after surgery can increase pain and swelling. For 1 week after your procedure: Avoid activities that take a lot of effort, such as exercise or sports. You may walk and climb stairs as needed for daily activity, but avoid long walks or climbing stairs for exercise. General instructions If you were given a sedative during the procedure, it can affect you for several hours. Do not drive or operate machinery until your health care provider says that it is safe. Do not take baths, swim, or use a hot tub until your health care provider approves. Ask your health care provider if you may take showers. You may only be allowed to take sponge baths. Do not use any products that contain nicotine or tobacco. These products include cigarettes, chewing tobacco, and vaping devices, such as e-cigarettes. If you need help quitting, ask  your health care provider. Keep all follow-up visits. This is important. Contact a health care provider if: You have any of these signs of infection: More redness, swelling, or pain around your incisions or your groin area. More fluid or blood coming from an incision. Warmth coming from an incision. Pus or a bad smell coming from an incision. A fever or chills. You  have more swelling in your scrotum, if you are male. You have severe pain and medicines do not help. You have abdominal pain or swelling. You cannot urinate or have a bowel movement. You faint or feel dizzy. You have nausea and vomiting. Get help right away if: You have redness, warmth, or pain in your leg. You have chest pain. You have problems breathing. These symptoms may represent a serious problem that is an emergency. Do not wait to see if the symptoms will go away. Get medical help right away. Call your local emergency services (911 in the [place] Do not drive yourself to the hospital. Summary Pain, swelling, and bruising are common after the procedure. Check your incision area every day for signs of infection, such as more redness, swelling, or pain. Put ice on painful or swollen areas for 20 minutes, 2-3 times a day. This information is not intended to replace advice given to you by your health care provider. Make sure you discuss any questions you have with your health care provider.  AMBULATORY SURGERY  DISCHARGE INSTRUCTIONS   The drugs that you were given will stay in your system until tomorrow so for the next 24 hours you should not:  Drive an automobile Make any legal decisions Drink any alcoholic beverage   You may resume regular meals tomorrow.  Today it is better to start with liquids and gradually work up to solid foods.  You may eat anything you prefer, but it is better to start with liquids, then soup and crackers, and gradually work up to solid foods.   Please notify your doctor immediately if you have any unusual bleeding, trouble breathing, redness and pain at the surgery site, drainage, fever, or pain not relieved by medication.    Additional Instructions:  Please contact your physician with any problems or Same Day Surgery at [phone], Monday through Friday 6 am to 4 pm, or [place] at [facility] number at [phone].   DO NOT REMOVE  GREEN TEAL ARMBAND FOR 4 DAYS

## 2023-01-29 NOTE — Anesthesia Procedure Notes (Signed)
Procedure Name: Intubation Date/Time: 01/29/2023 11:15 AM  Performed by: Otho Perl, CRNAPre-anesthesia Checklist: Patient identified, Patient being monitored, Timeout performed, Emergency Drugs available and Suction available Patient Re-evaluated:Patient Re-evaluated prior to induction Oxygen Delivery Method: Circle system utilized Preoxygenation: Pre-oxygenation with 100% oxygen Induction Type: IV induction Ventilation: Mask ventilation without difficulty Laryngoscope Size: Mac and 3 Grade View: Grade I Tube type: Oral Tube size: 7.5 mm Number of attempts: 1 Airway Equipment and Method: Stylet Placement Confirmation: ETT inserted through vocal cords under direct vision, positive ETCO2 and breath sounds checked- equal and bilateral Secured at: 21 cm Tube secured with: Tape Dental Injury: Teeth and Oropharynx as per pre-operative assessment

## 2023-01-29 NOTE — Transfer of Care (Signed)
Immediate Anesthesia Transfer of Care Note  Patient: Joel Ramirez  Procedure(s) Performed: XI ROBOTIC ASSISTED INGUINAL HERNIA (Right)  Patient Location: PACU  Anesthesia Type:General  Level of Consciousness: awake  Airway & Oxygen Therapy: Patient Spontanous Breathing and Patient connected to face mask oxygen  Post-op Assessment: Report given to RN and Post -op Vital signs reviewed and stable  Post vital signs: Reviewed  Last Vitals:  Vitals Value Taken Time  BP 140/89 01/29/23 1301  Temp 98 F   Pulse 79 01/29/23 1303  Resp 13 01/29/23 1303  SpO2 100 % 01/29/23 1303  Vitals shown include unvalidated device data.  Last Pain:  Vitals:   01/29/23 0927  TempSrc: Temporal  PainSc: 0-No pain         Complications: No notable events documented.

## 2023-01-30 NOTE — Anesthesia Postprocedure Evaluation (Signed)
Anesthesia Post Note  Patient: Joel Ramirez  Procedure(s) Performed: XI ROBOTIC ASSISTED INGUINAL HERNIA (Right)  Patient location during evaluation: PACU Anesthesia Type: General Level of consciousness: awake Pain management: pain level controlled Vital Signs Assessment: post-procedure vital signs reviewed and stable Respiratory status: spontaneous breathing and respiratory function stable Cardiovascular status: stable Anesthetic complications: no   No notable events documented.   Last Vitals:  Vitals:   01/29/23 1400 01/29/23 1546  BP: 132/89 (!) 154/90  Pulse: 72 62  Resp: (!) 9 16  Temp:    SpO2: 99% 97%    Last Pain:  Vitals:   01/29/23 1546  TempSrc:   PainSc: 2                  VAN STAVEREN,Kilea Mccarey

## 2023-02-09 ENCOUNTER — Encounter: Payer: Self-pay | Admitting: Surgery

## 2023-02-10 ENCOUNTER — Encounter: Payer: 59 | Admitting: Physician Assistant

## 2023-02-11 ENCOUNTER — Encounter: Payer: Self-pay | Admitting: Physician Assistant

## 2023-02-11 ENCOUNTER — Ambulatory Visit (INDEPENDENT_AMBULATORY_CARE_PROVIDER_SITE_OTHER): Payer: 59 | Admitting: Physician Assistant

## 2023-02-11 VITALS — BP 130/86 | HR 59 | Temp 98.1°F | Ht 73.0 in | Wt 182.2 lb

## 2023-02-11 DIAGNOSIS — Z09 Encounter for follow-up examination after completed treatment for conditions other than malignant neoplasm: Secondary | ICD-10-CM

## 2023-02-11 DIAGNOSIS — K4091 Unilateral inguinal hernia, without obstruction or gangrene, recurrent: Secondary | ICD-10-CM

## 2023-02-11 NOTE — Patient Instructions (Signed)

## 2023-02-11 NOTE — Progress Notes (Signed)
[facility] POST-OP OFFICE VISIT  02/11/2023  HPI: Joel Ramirez is a 62 y.o. male 13 days s/p robotic assisted laparoscopic right inguinal hernia repair with Dr Dahlia Byes  He is overall doing very well Sore but only needing tylenol intermittently No fever, chills, nausea, emesis Was constipated initially but that is resolved Incisions are healing well No scrotal edema nor bruising No other complaints   Vital signs: BP 130/86   Pulse (!) 59   Temp 98.1 F (36.7 C) (Oral)   Ht '6\' 1"'$  (1.854 m)   Wt 182 lb 3.2 oz (82.6 kg)   SpO2 99%   BMI 24.04 kg/m    Physical Exam: Constitutional: Well appearing male, NAD Abdomen: Soft, non-tender, non-distended, no rebound/guarding Skin: Laparoscopic incisions are healing well, no erythema or drainage   Assessment/Plan: This is a 62 y.o. male 13 days s/p robotic assisted laparoscopic right inguinal hernia repair with Dr Dahlia Byes   - Pain control prn  - Reviewed wound care recommendation  - Reviewed lifting restrictions; 6 weeks total  - He can follow up on as needed basis; He understands to call with questions/concerns  -- Edison Simon, PA-C [facility] 02/11/2023, 1:45 PM M-F: 7am - 4pm

## 2023-03-11 ENCOUNTER — Telehealth: Payer: Self-pay

## 2023-03-11 ENCOUNTER — Other Ambulatory Visit: Payer: Self-pay | Admitting: Urology

## 2023-03-11 ENCOUNTER — Ambulatory Visit (INDEPENDENT_AMBULATORY_CARE_PROVIDER_SITE_OTHER): Payer: 59 | Admitting: Urology

## 2023-03-11 ENCOUNTER — Encounter: Payer: Self-pay | Admitting: Urology

## 2023-03-11 VITALS — BP 141/88 | HR 67 | Ht 73.0 in | Wt 181.9 lb

## 2023-03-11 DIAGNOSIS — N433 Hydrocele, unspecified: Secondary | ICD-10-CM | POA: Diagnosis not present

## 2023-03-11 NOTE — Progress Notes (Signed)
03/11/23 9:34 AM   Joel Ramirez 1961/11/03 [id number]  CC: Left hydrocele, history of epididymitis  HPI: 62 year old healthy male referred for a large left hydrocele.  He has undergone multiple hernia repairs, and he thinks he has had the large left-sided hydrocele for about a year after undergoing the left-sided repair.  This is bulky and uncomfortable and is bothersome when he is physically active.  He also feels that it rubs on the inner skin of his thigh causing some irritation.  He also reports about 4-5 episodes of epididymitis over the last for 5 years that resolved with antibiotics.  He denies any significant urinary symptoms, no gross hematuria.  Recent CT scan from February 2024 shows a large left hydrocele but otherwise benign, no evidence of recurrent hernia.   PMH: Past Medical History:  Diagnosis Date   Actinic keratosis    Anemia    Dysplastic nevus 05/28/2020   Right mid medial thigh. Severe atypia, peripheral margin involved.   GERD (gastroesophageal reflux disease)    h/o   Hemorrhoids    History of alcohol abuse    Inguinal hernia     Surgical History: Past Surgical History:  Procedure Laterality Date   COLONOSCOPY WITH PROPOFOL N/A 06/29/2015   Procedure: COLONOSCOPY WITH PROPOFOL;  Surgeon: Josefine Class, MD;  Location: [facility] END[facility]COPY;  Service: Endoscopy;  Laterality: N/A;   INGUINAL HERNIA REPAIR Bilateral 02/12/2016   Procedure: HERNIA REPAIR INGUINAL ADULT BILATERAL;  Surgeon: Leonie Green, MD;  Location: [facility] ORS;  Service: General;  Laterality: Bilateral;   INGUINAL HERNIA REPAIR Left 10/08/2017   Procedure: LAPAROSCOPIC INGUINAL HERNIA WITH MESH;  Surgeon: Leonie Green, MD;  Location: [facility] ORS;  Service: General;  Laterality: Left;   KNEE ARTHROSCOPY Left      Family History: No family history on file.  Social History:  reports that he quit smoking about 20 years ago. His smoking use included cigarettes. He has a 20.00  pack-year smoking history. He has never used smokeless tobacco. He reports current drug use. Drug: Marijuana. He reports that he does not drink alcohol.  Physical Exam: BP (!) 141/88 (BP Location: Left Arm, Patient Position: Sitting, Cuff Size: Normal)   Pulse 67   Ht 6\' 1"  (1.854 m)   Wt 181 lb 14.4 oz (82.5 kg)   BMI 24.00 kg/m    Constitutional:  Alert and oriented, No acute distress. Cardiovascular: No clubbing, cyanosis, or edema. Respiratory: Normal respiratory effort, no increased work of breathing. GI: Abdomen is soft, nontender, nondistended, no abdominal masses GU: Large left hydrocele, minimally tender, no evidence of hernia recurrence, testicles 20 cc and descended bilaterally   Pertinent Imaging: I have personally viewed and interpreted the CT scan showing no evidence of recurrent hernia, large left hydrocele partially visualized.  Assessment & Plan:   62 year old male with a large left hydrocele interested in treatment options.  We reviewed options including observation, hydrocele aspiration, or definitive treatment with hydrocelectomy.  I recommended more definitive treatment with hydrocelectomy.  We discussed this is a 30-minute procedure performed under general anesthesia, 1 to 2% chance of bleeding or infection, less than 5% risk of recurrence, postoperative soreness/bruising/pain, need for reduced strenuous activity for at least 1 to 2 weeks, snug fitting underwear, icing, and 6 to 8-week period before swelling/bruising completely resolved.  He would like to pursue definitive treatment with hydrocelectomy.  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  We discussed this is likely unrelated to his left hydrocele.  Schedule left hydrocelectomy  Nickolas Madrid, MD 03/11/2023  [facility] [address], [place], [place] [address] [phone]

## 2023-03-11 NOTE — Telephone Encounter (Signed)
I spoke with Joel Ramirez. We have discussed possible surgery dates and Friday April 12th, 2024 was agreed upon by all parties. Patient given information about surgery date, what to expect pre-operatively and post operatively.  We discussed that a Pre-Admission Testing office will be calling to set up the pre-op visit that will take place prior to surgery, and that these appointments are typically done over the phone with a Pre-Admissions RN. Informed patient that our office will communicate any additional care to be provided after surgery. Patients questions or concerns were discussed during our call. Advised to call our office should there be any additional information, questions or concerns that arise. Patient verbalized understanding.

## 2023-03-11 NOTE — H&P (View-Only) (Signed)
 03/11/23 9:34 AM   Joel Ramirez 06/03/1961 [id number]  CC: Left hydrocele, history of epididymitis  HPI: 62-year-old healthy male referred for a large left hydrocele.  He has undergone multiple hernia repairs, and he thinks he has had the large left-sided hydrocele for about a year after undergoing the left-sided repair.  This is bulky and uncomfortable and is bothersome when he is physically active.  He also feels that it rubs on the inner skin of his thigh causing some irritation.  He also reports about 4-5 episodes of epididymitis over the last for 5 years that resolved with antibiotics.  He denies any significant urinary symptoms, no gross hematuria.  Recent CT scan from February 2024 shows a large left hydrocele but otherwise benign, no evidence of recurrent hernia.   PMH: Past Medical History:  Diagnosis Date   Actinic keratosis    Anemia    Dysplastic nevus 05/28/2020   Right mid medial thigh. Severe atypia, peripheral margin involved.   GERD (gastroesophageal reflux disease)    h/o   Hemorrhoids    History of alcohol abuse    Inguinal hernia     Surgical History: Past Surgical History:  Procedure Laterality Date   COLONOSCOPY WITH PROPOFOL N/A 06/29/2015   Procedure: COLONOSCOPY WITH PROPOFOL;  Surgeon: Matthew Gordon Rein, MD;  Location: [facility] ENDOSCOPY;  Service: Endoscopy;  Laterality: N/A;   INGUINAL HERNIA REPAIR Bilateral 02/12/2016   Procedure: HERNIA REPAIR INGUINAL ADULT BILATERAL;  Surgeon: Jarvis Wilton Smith, MD;  Location: [facility] ORS;  Service: General;  Laterality: Bilateral;   INGUINAL HERNIA REPAIR Left 10/08/2017   Procedure: LAPAROSCOPIC INGUINAL HERNIA WITH MESH;  Surgeon: Smith, Jarvis Wilton, MD;  Location: [facility] ORS;  Service: General;  Laterality: Left;   KNEE ARTHROSCOPY Left      Family History: No family history on file.  Social History:  reports that he quit smoking about 20 years ago. His smoking use included cigarettes. He has a 20.00  pack-year smoking history. He has never used smokeless tobacco. He reports current drug use. Drug: Marijuana. He reports that he does not drink alcohol.  Physical Exam: BP (!) 141/88 (BP Location: Left Arm, Patient Position: Sitting, Cuff Size: Normal)   Pulse 67   Ht 6' 1" (1.854 m)   Wt 181 lb 14.4 oz (82.5 kg)   BMI 24.00 kg/m    Constitutional:  Alert and oriented, No acute distress. Cardiovascular: No clubbing, cyanosis, or edema. Respiratory: Normal respiratory effort, no increased work of breathing. GI: Abdomen is soft, nontender, nondistended, no abdominal masses GU: Large left hydrocele, minimally tender, no evidence of hernia recurrence, testicles 20 cc and descended bilaterally   Pertinent Imaging: I have personally viewed and interpreted the CT scan showing no evidence of recurrent hernia, large left hydrocele partially visualized.  Assessment & Plan:   62-year-old male with a large left hydrocele interested in treatment options.  We reviewed options including observation, hydrocele aspiration, or definitive treatment with hydrocelectomy.  I recommended more definitive treatment with hydrocelectomy.  We discussed this is a 30-minute procedure performed under general anesthesia, 1 to 2% chance of bleeding or infection, less than 5% risk of recurrence, postoperative soreness/bruising/pain, need for reduced strenuous activity for at least 1 to 2 weeks, snug fitting underwear, icing, and 6 to 8-week period before swelling/bruising completely resolved.  He would like to pursue definitive treatment with hydrocelectomy.  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    We discussed this is likely unrelated to his left hydrocele.  Schedule left hydrocelectomy  Aman Batley, MD 03/11/2023  [facility] [address], [address] [place], [place] [address] [phone]   

## 2023-03-11 NOTE — Patient Instructions (Signed)
Hydrocele, Adult A hydrocele is a collection of fluid in the loose pouch of skin that holds the testicles (scrotum). It can occur in one or both testicles. This may happen because: The amount of fluid produced in the scrotum is not absorbed by the rest of the body. Fluid from the abdomen fills the scrotum. Normally, the testicles develop in the abdomen and then drop into the scrotum before birth. The tube that the testicles travel through usually closes after the testicles drop. If the tube does not close, fluid from the abdomen can fill the scrotum. This is not very common in adults. What are the causes? A hydrocele may be caused by: An injury to the scrotum. An infection. Decreased blood flow to the scrotum. Twisting of a testicle (testicular torsion). A birth defect. A tumor or cancer of the testicle. Sometimes, the cause is not known. What are the signs or symptoms? A hydrocele feels like a water-filled balloon. It may also feel heavy. Other symptoms include: Swelling of the scrotum. The swelling may decrease when you lie down. You may also notice more swelling at night than in the morning. This is called a communicating hydrocele, in which the fluid in the scrotum goes back into the abdominal cavity when the position of the scrotum changes. Swelling of the groin. Mild discomfort in the scrotum. Pain. This can develop if the hydrocele was caused by infection or twisting. The larger the hydrocele, the more likely you are to have pain. Swelling may also cause pain. How is this diagnosed? This condition may be diagnosed based on a physical exam and your medical history. You may also have tests, including: Imaging tests, such as an ultrasound. A transillumination test. This test takes place in a dark room where a light is placed on the skin of the scrotum. Clear liquid will not impede the light and the scrotum will be illuminated. This helps a health care provider distinguish a hydrocele from a  tumor. Blood or urine tests. How is this treated? Most hydroceles go away on their own. If you have no discomfort or pain, your health care provider may suggest close monitoring of your condition until the condition goes away or symptoms develop. This is called watch and wait or watchful waiting. If treatment is needed, it may include: Treating an underlying condition. This may include taking an antibiotic medicine to treat an infection. Having surgery to stop fluid from collecting in the scrotum. Having surgery to drain the fluid. Surgery may include: Hydrocelectomy. For this procedure, an incision is made in the scrotum to remove the fluid. Needle aspiration. A needle is used to drain fluid. However, the fluid buildup will come back quickly and may lead to an infection of the scrotum. This is rarely done. Follow these instructions at home: Medicines Take over-the-counter and prescription medicines only as told by your health care provider. If you were prescribed an antibiotic medicine, take it as told by your health care provider. Do not stop taking the antibiotic even if you start to feel better. General instructions Watch the hydrocele for any changes. Keep all follow-up visits. This is important. Contact a health care provider if: You notice any changes in the hydrocele. The swelling in your scrotum or groin gets worse. The hydrocele becomes red, firm, painful, or tender to the touch. You have a fever. Get help right away if you: Develop a lot of pain or your pain becomes worse. Have chills. Have a high fever. Summary A hydrocele is  a collection of fluid in the loose pouch of skin that holds the testicles (scrotum). A hydrocele can cause swelling, discomfort, and pain. In adults, the cause of a hydrocele may not be known. However, it is sometimes caused by an infection or the twisting of a testicle. Hydroceles often go away on their own. If a hydrocele causes pain, treating the  underlying cause may be needed to ease the pain. This information is not intended to replace advice given to you by your health care provider. Make sure you discuss any questions you have with your health care provider. Document Revised: 07/18/2021 Document Reviewed: 07/18/2021 Elsevier Patient Education  [place], Adult  A hydrocelectomy is a surgical procedure to remove a collection of fluid (hydrocele) from the scrotum. The scrotum is the pouch that holds the testicles. You may need to have this procedure if a hydrocele is causing painful swelling in your scrotum. Tell a health care provider about: Any allergies you have. All medicines you are taking, including vitamins, herbs, eye drops, creams, and over-the-counter medicines. Any problems you or family members have had with anesthetic medicines. Any bleeding problems you have. Any surgeries you have had. Any medical conditions you have. What are the risks? Generally, this is a safe procedure. However, problems may occur, including: Bleeding into the scrotum (scrotal hematoma). Damage to nearby structures or organs, including to the testicle or the tube that carries sperm out of the testicle (vas deferens). Infection. Allergic reactions to medicines. What happens before the procedure? When to stop eating and drinking Follow instructions from your health care provider about what you may eat and drink before your procedure. These may include: 8 hours before your procedure Stop eating most foods. Do not eat meat, fried foods, or fatty foods. Eat only light foods, such as toast or crackers. All liquids are okay except energy drinks and alcohol. 6 hours before your procedure Stop eating. Drink only clear liquids, such as water, clear fruit juice, black coffee, plain tea, and sports drinks. Do not drink energy drinks or alcohol. 2 hours before your procedure Stop drinking all liquids. You may be allowed to  take medicines with small sips of water. If you do not follow your health care provider's instructions, your procedure may be delayed or canceled. Medicines Ask your health care provider about: Changing or stopping your regular medicines. This is especially important if you are taking diabetes medicines or blood thinners. Taking medicines such as aspirin and ibuprofen. These medicines can thin your blood. Do not take these medicines unless your health care provider tells you to take them. Taking over-the-counter medicines, vitamins, herbs, and supplements. Surgery safety Ask your health care provider: How your surgery site will be marked. What steps will be taken to help prevent infection. These steps may include: Removing hair at the surgery site. Washing skin with a germ-killing soap. Taking antibiotic medicine. General instructions Do not use any products that contain nicotine or tobacco for at least 4 weeks before the procedure. These products include cigarettes, chewing tobacco, and vaping devices, such as e-cigarettes. If you need help quitting, ask your health care provider. If you will be going home right after the procedure, plan to have a responsible adult: Take you home from the hospital or clinic. You will not be allowed to drive. Care for you for the time you are told. What happens during the procedure? An IV will be inserted into one of your veins. You will be given one  or both of the following: A medicine to make you relax (sedative). A medicine to make you fall asleep (general anesthetic). A small incision will be made through the skin of your scrotum. Your testicle and the hydrocele will be located, and the hydrocele sac will be opened with an incision. The fluid will be drained from the hydrocele. Part of the hydrocele sac may be removed. The hydrocele will be closed with stitches that dissolve (absorbable sutures). This prevents fluid from building up again. If your  hydrocele is large, you may have a thin, rubber drain placed to allow fluid to drain after the procedure. The incision in your scrotum will be closed with absorbable sutures, skin glue, or adhesive strips. A bandage (dressing) will be placed over the incision. The dressing may be held in place with an athletic support strap (scrotal support). The procedure may vary among health care providers and hospitals. What happens after the procedure?  Your blood pressure, heart rate, breathing rate, and blood oxygen level will be monitored until you leave the hospital or clinic. You will be given pain medicine as needed. Your IV will be removed, and your insertion site will be checked for bleeding. You may need to wear a scrotal support. This holds the dressing in place and supports your scrotum. If you were given a sedative during the procedure, it can affect you for several hours. Do not drive or operate machinery until your health care provider says that it is safe. Summary A hydrocelectomy is a surgical procedure to remove a collection of fluid from the scrotum. You may need to have this procedure if a hydrocele is causing painful swelling in your scrotum. During the procedure, the hydrocele will be drained and then closed with stitches that dissolve (absorbable sutures). This prevents fluid from building up again. If your hydrocele is large, you may have a thin, rubber drain placed to allow fluid to drain after the procedure. You may need to wear a scrotal support after your procedure. This holds the dressing in place and supports your scrotum. This information is not intended to replace advice given to you by your health care provider. Make sure you discuss any questions you have with your health care provider. Document Revised: 07/18/2021 Document Reviewed: 07/18/2021 Elsevier Patient Education  [place], Adult, Care After The following information offers guidance on how  to care for yourself after your procedure. Your health care provider may also give you more specific instructions. If you have problems or questions, contact your health care provider. What can I expect after the procedure? After your procedure, it is common to have: Mild discomfort and swelling in the pouch that holds your testicles (scrotum). Bruising of the scrotum. Follow these instructions at home: Medicines Take over-the-counter and prescription medicines only as told by your health care provider. Ask your health care provider if the medicine prescribed to you: Requires you to avoid driving or using machinery. Can cause constipation. You may need to take these actions to prevent or treat constipation: Drink enough fluid to keep your urine pale yellow. Take over-the-counter or prescription medicines. Eat foods that are high in fiber, such as beans, whole grains, and fresh fruits and vegetables. Limit foods that are high in fat and processed sugars, such as fried or sweet foods. Bathing Do not take baths, swim, or use a hot tub until your health care provider approves. Ask your health care provider if you may take showers. You may only be  allowed to take sponge baths. If you were told to wear an athletic support strap (scrotal support), keep it dry. Take it off when you shower or bathe. Incision care  Follow instructions from your health care provider about how to take care of your incision. Make sure you: Wash your hands with soap and water for at least 20 seconds before and after you change your bandage (dressing). If soap and water are not available, use hand sanitizer. Change your dressing as told by your health care provider. Leave stitches (sutures), skin glue, or adhesive strips in place. These skin closures may need to stay in place for 2 weeks or longer. If adhesive strip edges start to loosen and curl up, you may trim the loose edges. Do not remove adhesive strips completely  unless your health care provider tells you to do that. Check your incision and scrotum every day for signs of infection. Check for: More redness, swelling, or pain. Fluid or blood. Warmth. Pus or a bad smell. Managing pain and swelling If directed, put ice on the affected area. To do this: Put ice in a plastic bag. Place a towel between your skin and the bag. Leave the ice on for 20 minutes, 2-3 times per day. Remove the ice if your skin turns bright red. This is very important. If you cannot feel pain, heat, or cold, you have a greater risk of damage to the area.  Activity Do not lift anything that is heavier than 10 lb (4.5 kg) until your health care provider says that it is safe. If you were given a sedative during the procedure, it can affect you for several hours. Do not drive or operate machinery until your health care provider says that it is safe. Ask your health care provider when it is safe to drive. Return to your normal activities as told by your health care provider. Ask your health care provider what activities are safe for you. General instructions Do not use any products that contain nicotine or tobacco. These products include cigarettes, chewing tobacco, and vaping devices, such as e-cigarettes. These can delay healing after surgery. If you need help quitting, ask your health care provider. If you were given a scrotal support, wear it as told by your health care provider. Keep all follow-up visits. This is important. If you had a drain put in during the procedure, you will need to have it removed at a follow-up visit. Contact a health care provider if: Your pain is not controlled with medicine. You have more redness, swelling, or pain around your scrotum. You have fluid or blood coming from your incision. Your incision feels warm to the touch. You have pus or a bad smell coming from your incision. You have a fever. Get help right away if: You develop shaking, chills,  and a fever that is higher than 101.47F (38.8C). You have redness or swelling that starts at your scrotum and spreads outward to your whole groin. You develop swelling in your legs. You have difficulty breathing. These symptoms may be an emergency. Get help right away. Call 911. Do not wait to see if the symptoms will go away. Do not drive yourself to the hospital. Summary After a hydrocelectomy, it is common to have mild discomfort, swelling, and bruising. Do not take baths, swim, or use a hot tub until your health care provider approves. Ask your health care provider if you may take showers. If directed, put ice on the affected area to help with  pain and swelling. Do not lift anything that is heavier than 10 lb (4.5 kg) until your health care provider says that it is safe. Return to your normal activities as told by your health care provider. If you were given a scrotal support, keep it dry. Wear the scrotal support as told by your health care provider. This information is not intended to replace advice given to you by your health care provider. Make sure you discuss any questions you have with your health care provider. Document Revised: 07/18/2021 Document Reviewed: 07/18/2021 Elsevier Patient Education  [place].

## 2023-03-11 NOTE — Progress Notes (Signed)
   [facility]  Surgery Date: Date: 03/27/2023  Surgeon: Dr. Nickolas Madrid, MD  Inpt ( No  )   Outpt (Yes)   Obs ( No  )   Diagnosis: N43.3 Left Hydrocele  -CPT: [id number]  Surgery: Left Hydrocelectomy  Stop Anticoagulations: Yes and also hold ASA  Cardiac/Medical/Pulmonary Clearance needed: no  *Orders entered into EPIC  Date: 03/11/23   *Case booked in [place]  Date: 03/11/23  *Notified pt of Surgery: Date: 03/11/23  PRE-OP UA & CX: no  *Placed into Prior Authorization Work [place] Date: 03/11/23  Assistant/laser/rep:No

## 2023-03-11 NOTE — Progress Notes (Signed)
Surgical Physician Order Form [facility]  * Scheduling expectation : Next Available  *Length of Case: 1 hour  *Clearance needed: no  *Anticoagulation Instructions: Hold all anticoagulants  *Aspirin Instructions: Hold Aspirin  *Post-op visit Date/Instructions: 8-week follow-up PA wound check  *Diagnosis: Hydrocele  *Procedure: left Hydrocele excision groin/unilateral scrotal approach [id number])   Additional orders: N/A  -Admit type: OUTpatient  -Anesthesia: General  -VTE Prophylaxis Standing Order SCD's       Other:   -Standing Lab Orders Per Anesthesia    Lab other: None  -Standing Test orders EKG/Chest x-ray per Anesthesia       Test other:   - Medications:  Ancef 2gm IV  -Other orders:  N/A

## 2023-03-19 ENCOUNTER — Other Ambulatory Visit: Payer: Self-pay

## 2023-03-19 ENCOUNTER — Encounter
Admission: RE | Admit: 2023-03-19 | Discharge: 2023-03-19 | Disposition: A | Discharge status: Home / Self Care | Payer: 59 | Source: Ambulatory Visit | Attending: Urology | Admitting: Urology

## 2023-03-19 DIAGNOSIS — M26601 Right temporomandibular joint disorder, unspecified: Secondary | ICD-10-CM | POA: Diagnosis not present

## 2023-03-19 HISTORY — DX: Unspecified osteoarthritis, unspecified site: M19.90

## 2023-03-19 HISTORY — DX: Malignant (primary) neoplasm, unspecified: C80.1

## 2023-03-19 NOTE — Patient Instructions (Addendum)
Your procedure is scheduled on: 03/27/23 - Friday Report to the Registration Desk on the 1st floor of the [facility]. To find out your arrival time, please call [phone] between 1PM - 3PM on: 03/26/23 - Thursday If your arrival time is 6:00 am, do not arrive before that time as the [facility] entrance doors do not open until 6:00 am.  REMEMBER: Instructions that are not followed completely may result in serious medical risk, up to and including death; or upon the discretion of your surgeon and anesthesiologist your surgery may need to be rescheduled.  Do not eat food or drink any fluids after midnight the night before surgery.  No gum chewing or hard candies.  One week prior to surgery: Stop Anti-inflammatories (NSAIDS) such as Advil, Aleve, Ibuprofen, Motrin, Naproxen, Naprosyn and Aspirin based products such as Excedrin, Goody's Powder, BC Powder.  You may however, continue to take Tylenol if needed for pain up until the day of surgery.  Stop ANY OVER THE COUNTER supplements until after surgery.Saw Palmetto , THC Gummy.   TAKE ONLY THESE MEDICATIONS THE MORNING OF SURGERY WITH A SIP OF WATER:  NONE   No Alcohol for 24 hours before or after surgery.  No Smoking including e-cigarettes for 24 hours before surgery.  No chewable tobacco products for at least 6 hours before surgery.  No nicotine patches on the day of surgery.  Do not use any "recreational" drugs for at least a week (preferably 2 weeks) before your surgery.  Please be advised that the combination of cocaine and anesthesia may have negative outcomes, up to and including death. If you test positive for cocaine, your surgery will be cancelled.  On the morning of surgery brush your teeth with toothpaste and water, you may rinse your mouth with mouthwash if you wish. Do not swallow any toothpaste or mouthwash.  Do not wear jewelry, make-up, hairpins, clips or nail polish.  Do not wear lotions, powders, or  perfumes.   Do not shave body hair from the neck down 48 hours before surgery.  Contact lenses, hearing aids and dentures may not be worn into surgery.  Do not bring valuables to the hospital. [facility] is not responsible for any missing/lost belongings or valuables.   Notify your doctor if there is any change in your medical condition (cold, fever, infection).  Wear comfortable clothing (specific to your surgery type) to the hospital.  After surgery, you can help prevent lung complications by doing breathing exercises.  Take deep breaths and cough every 1-2 hours. Your doctor may order a device called an Incentive Spirometer to help you take deep breaths. When coughing or sneezing, hold a pillow firmly against your incision with both hands. This is called "splinting." Doing this helps protect your incision. It also decreases belly discomfort.  If you are being admitted to the hospital overnight, leave your suitcase in the car. After surgery it may be brought to your room.  In case of increased patient census, it may be necessary for you, the patient, to continue your postoperative care in the Same Day Surgery department.  If you are being discharged the day of surgery, you will not be allowed to drive home. You will need a responsible individual to drive you home and stay with you for 24 hours after surgery.   If you are taking public transportation, you will need to have a responsible individual with you.  Please call the [facility] Dept. at [phone] if you  have any questions about these instructions.  Surgery Visitation Policy:  Patients having surgery or a procedure may have two visitors.  Children under the age of 38 must have an adult with them who is not the patient.  Inpatient Visitation:    Visiting hours are 7 a.m. to 8 p.m. Up to four visitors are allowed at one time in a patient room. The visitors may rotate out with other people during the day.   One visitor age 67 or older may stay with the patient overnight and must be in the room by 8 p.m.

## 2023-03-27 ENCOUNTER — Ambulatory Visit
Admission: RE | Admit: 2023-03-27 | Discharge: 2023-03-27 | Disposition: A | Discharge status: Home / Self Care | Payer: 59 | Attending: Urology | Admitting: Urology

## 2023-03-27 ENCOUNTER — Other Ambulatory Visit: Payer: Self-pay

## 2023-03-27 ENCOUNTER — Encounter: Payer: Self-pay | Admitting: Urology

## 2023-03-27 ENCOUNTER — Ambulatory Visit: Payer: 59 | Admitting: Certified Registered"

## 2023-03-27 ENCOUNTER — Encounter
Admission: RE | Disposition: A | Discharge status: Home / Self Care | Payer: Self-pay | Source: Home / Self Care | Attending: Urology

## 2023-03-27 DIAGNOSIS — Z87891 Personal history of nicotine dependence: Secondary | ICD-10-CM | POA: Insufficient documentation

## 2023-03-27 DIAGNOSIS — N433 Hydrocele, unspecified: Secondary | ICD-10-CM

## 2023-03-27 HISTORY — PX: HYDROCELE EXCISION: SHX482

## 2023-03-27 SURGERY — HYDROCELECTOMY
Anesthesia: General | Laterality: Left

## 2023-03-27 MED ORDER — FENTANYL CITRATE (PF) 100 MCG/2ML IJ SOLN
INTRAMUSCULAR | Status: AC
  Filled 2023-03-27: qty 2

## 2023-03-27 MED ORDER — HYDROCODONE-ACETAMINOPHEN 5-325 MG PO TABS
ORAL_TABLET | ORAL | Status: AC
  Filled 2023-03-27: qty 1

## 2023-03-27 MED ORDER — BACITRACIN ZINC 500 UNIT/GM EX OINT
TOPICAL_OINTMENT | CUTANEOUS | Status: DC | PRN
  Administered 2023-03-27: 1 via TOPICAL

## 2023-03-27 MED ORDER — ONDANSETRON HCL 4 MG/2ML IJ SOLN: 4.0000 mg | Freq: Once | INTRAMUSCULAR | Status: DC | PRN

## 2023-03-27 MED ORDER — DEXAMETHASONE SODIUM PHOSPHATE 10 MG/ML IJ SOLN
INTRAMUSCULAR | Status: DC | PRN
  Administered 2023-03-27: 10 mg via INTRAVENOUS

## 2023-03-27 MED ORDER — HYDROCODONE-ACETAMINOPHEN 5-325 MG PO TABS
1.0000 | ORAL_TABLET | Freq: Once | ORAL | Status: AC
  Administered 2023-03-27: 1 via ORAL

## 2023-03-27 MED ORDER — LACTATED RINGERS IV SOLN: INTRAVENOUS | Status: DC

## 2023-03-27 MED ORDER — BACITRACIN ZINC 500 UNIT/GM EX OINT
TOPICAL_OINTMENT | CUTANEOUS | Status: AC
  Filled 2023-03-27: qty 28.35

## 2023-03-27 MED ORDER — FAMOTIDINE 20 MG PO TABS
ORAL_TABLET | ORAL | Status: AC
  Filled 2023-03-27: qty 1

## 2023-03-27 MED ORDER — LIDOCAINE HCL (CARDIAC) PF 100 MG/5ML IV SOSY
PREFILLED_SYRINGE | INTRAVENOUS | Status: DC | PRN
  Administered 2023-03-27: 100 mg via INTRAVENOUS

## 2023-03-27 MED ORDER — ORAL CARE MOUTH RINSE: 15.0000 mL | Freq: Once | OROMUCOSAL | Status: AC

## 2023-03-27 MED ORDER — LIDOCAINE HCL 1 % IJ SOLN
INTRAMUSCULAR | Status: DC | PRN
  Administered 2023-03-27: 10 mL

## 2023-03-27 MED ORDER — FENTANYL CITRATE (PF) 100 MCG/2ML IJ SOLN
25.0000 ug | INTRAMUSCULAR | Status: DC | PRN
  Administered 2023-03-27 (×4): 25 ug via INTRAVENOUS

## 2023-03-27 MED ORDER — CEFAZOLIN SODIUM-DEXTROSE 2-4 GM/100ML-% IV SOLN
INTRAVENOUS | Status: AC
  Filled 2023-03-27: qty 100

## 2023-03-27 MED ORDER — CHLORHEXIDINE GLUCONATE 0.12 % MT SOLN
15.0000 mL | Freq: Once | OROMUCOSAL | Status: AC
  Administered 2023-03-27: 15 mL via OROMUCOSAL

## 2023-03-27 MED ORDER — CEFAZOLIN SODIUM-DEXTROSE 2-4 GM/100ML-% IV SOLN
2.0000 g | INTRAVENOUS | Status: AC
  Administered 2023-03-27: 2 g via INTRAVENOUS

## 2023-03-27 MED ORDER — HYDROCODONE-ACETAMINOPHEN 5-325 MG PO TABS: 1.0000 | ORAL_TABLET | Freq: Four times a day (QID) | ORAL | 0 refills | Status: AC | PRN

## 2023-03-27 MED ORDER — LIDOCAINE HCL (PF) 1 % IJ SOLN
INTRAMUSCULAR | Status: AC
  Filled 2023-03-27: qty 30

## 2023-03-27 MED ORDER — FENTANYL CITRATE (PF) 100 MCG/2ML IJ SOLN
INTRAMUSCULAR | Status: DC | PRN
  Administered 2023-03-27 (×3): 50 ug via INTRAVENOUS

## 2023-03-27 MED ORDER — CHLORHEXIDINE GLUCONATE 0.12 % MT SOLN
OROMUCOSAL | Status: AC
  Filled 2023-03-27: qty 15

## 2023-03-27 MED ORDER — FAMOTIDINE 20 MG PO TABS
20.0000 mg | ORAL_TABLET | Freq: Once | ORAL | Status: AC
  Administered 2023-03-27: 20 mg via ORAL

## 2023-03-27 MED ORDER — ONDANSETRON HCL 4 MG/2ML IJ SOLN
INTRAMUSCULAR | Status: DC | PRN
  Administered 2023-03-27: 4 mg via INTRAVENOUS

## 2023-03-27 MED ORDER — PROPOFOL 10 MG/ML IV BOLUS
INTRAVENOUS | Status: DC | PRN
  Administered 2023-03-27: 200 mg via INTRAVENOUS

## 2023-03-27 MED ORDER — MIDAZOLAM HCL 2 MG/2ML IJ SOLN
INTRAMUSCULAR | Status: AC
  Filled 2023-03-27: qty 2

## 2023-03-27 SURGICAL SUPPLY — 34 items
APL PRP STRL LF DISP 70% ISPRP (MISCELLANEOUS) ×1
BLADE CLIPPER SURG (BLADE) ×1 IMPLANT
BLADE SURG 15 STRL LF DISP TIS (BLADE) ×1 IMPLANT
BLADE SURG 15 STRL SS (BLADE) ×1
BRIEF MESH DISP 2XL (UNDERPADS AND DIAPERS) ×1 IMPLANT
CHLORAPREP W/TINT 26 (MISCELLANEOUS) ×1 IMPLANT
DRAIN PENROSE 12X.25 LTX STRL (MISCELLANEOUS) IMPLANT
DRAPE LAPAROTOMY 77X122 PED (DRAPES) ×1 IMPLANT
DRSG GAUZE FLUFF 36X18 (GAUZE/BANDAGES/DRESSINGS) ×1 IMPLANT
DRSG TELFA 3X4 N-ADH STERILE (GAUZE/BANDAGES/DRESSINGS) ×1 IMPLANT
ELECT REM PT RETURN 9FT ADLT (ELECTROSURGICAL) ×1
ELECTRODE REM PT RTRN 9FT ADLT (ELECTROSURGICAL) ×1 IMPLANT
GAUZE 4X4 16PLY ~~LOC~~+RFID DBL (SPONGE) ×1 IMPLANT
GAUZE SPONGE 4X4 12PLY STRL (GAUZE/BANDAGES/DRESSINGS) IMPLANT
GLOVE BIOGEL PI IND STRL 7.5 (GLOVE) ×1 IMPLANT
GOWN STRL REUS W/ TWL LRG LVL3 (GOWN DISPOSABLE) ×1 IMPLANT
GOWN STRL REUS W/ TWL XL LVL3 (GOWN DISPOSABLE) ×1 IMPLANT
GOWN STRL REUS W/TWL LRG LVL3 (GOWN DISPOSABLE) ×1
GOWN STRL REUS W/TWL XL LVL3 (GOWN DISPOSABLE) ×1
KIT TURNOVER KIT A (KITS) ×1 IMPLANT
LABEL OR SOLS (LABEL) ×1 IMPLANT
MANIFOLD NEPTUNE II (INSTRUMENTS) ×1 IMPLANT
NDL HYPO 25X1 1.5 SAFETY (NEEDLE) ×1 IMPLANT
NEEDLE HYPO 25X1 1.5 SAFETY (NEEDLE) ×1 IMPLANT
NS IRRIG 500ML POUR BTL (IV SOLUTION) ×1 IMPLANT
PACK BASIN MINOR ARMC (MISCELLANEOUS) ×1 IMPLANT
SUT CHROMIC 3 0 PS 2 (SUTURE) ×1 IMPLANT
SUT ETHILON 3-0 FS-10 30 BLK (SUTURE)
SUT VIC AB 2-0 SH 27 (SUTURE) ×2
SUT VIC AB 2-0 SH 27XBRD (SUTURE) ×2 IMPLANT
SUTURE EHLN 3-0 FS-10 30 BLK (SUTURE) IMPLANT
SYR 10ML LL (SYRINGE) ×1 IMPLANT
TRAP FLUID SMOKE EVACUATOR (MISCELLANEOUS) ×1 IMPLANT
WATER STERILE IRR 500ML POUR (IV SOLUTION) ×1 IMPLANT

## 2023-03-27 NOTE — Anesthesia Preprocedure Evaluation (Signed)
Anesthesia Evaluation  Patient identified by MRN, date of birth, ID band Patient awake    Reviewed: Allergy & Precautions, NPO status , Patient's Chart, lab work & pertinent test results  Airway Mallampati: II  TM Distance: <3 FB Neck ROM: Full    Dental  (+) Teeth Intact   Pulmonary neg pulmonary ROS, Patient abstained from smoking., former smoker   Pulmonary exam normal        Cardiovascular Exercise Tolerance: Good negative cardio ROS Normal cardiovascular exam Rhythm:Regular Rate:Normal     Neuro/Psych negative neurological ROS  negative psych ROS   GI/Hepatic negative GI ROS, Neg liver ROS,GERD  Medicated,,  Endo/Other  negative endocrine ROS    Renal/GU negative Renal ROS  negative genitourinary   Musculoskeletal  (+) Arthritis ,    Abdominal Normal abdominal exam  (+)   Peds negative pediatric ROS (+)  Hematology negative hematology ROS (+) Blood dyscrasia   Anesthesia Other Findings Past Medical History: No date: Actinic keratosis No date: Anemia No date: Arthritis No date: Cancer     Comment:  skin- sqamous 05/28/2020: Dysplastic nevus     Comment:  Right mid medial thigh. Severe atypia, peripheral margin              involved. No date: GERD (gastroesophageal reflux disease)     Comment:  h/o No date: Hemorrhoids No date: History of alcohol abuse No date: Inguinal hernia  Past Surgical History: 06/29/2015: COLONOSCOPY WITH PROPOFOL; N/A     Comment:  Procedure: COLONOSCOPY WITH PROPOFOL;  Surgeon: Elnita Maxwell, MD;  Location: [facility] ENDOSCOPY;  Service:               Endoscopy;  Laterality: N/A; 2023: HERNIA REPAIR; Right     Comment:  inguinal 02/12/2016: INGUINAL HERNIA REPAIR; Bilateral     Comment:  Procedure: HERNIA REPAIR INGUINAL ADULT BILATERAL;                Surgeon: Nadeen Landau, MD;  Location: [facility] ORS;                Service: General;  Laterality:  Bilateral; 10/08/2017: INGUINAL HERNIA REPAIR; Left     Comment:  Procedure: LAPAROSCOPIC INGUINAL HERNIA WITH MESH;                Surgeon: Nadeen Landau, MD;  Location: [facility] ORS;                Service: General;  Laterality: Left; No date: KNEE ARTHROSCOPY; Left  BMI    Body Mass Index: 22.63 kg/m      Reproductive/Obstetrics negative OB ROS                             Anesthesia Physical Anesthesia Plan  ASA: 2  Anesthesia Plan: General   Post-op Pain Management:    Induction: Intravenous  PONV Risk Score and Plan: Dexamethasone, Ondansetron, Midazolam and Treatment may vary due to age or medical condition  Airway Management Planned: LMA  Additional Equipment:   Intra-op Plan:   Post-operative Plan: Extubation in OR  Informed Consent: I have reviewed the patients History and Physical, chart, labs and discussed the procedure including the risks, benefits and alternatives for the proposed anesthesia with the patient or authorized representative who has indicated his/her understanding and acceptance.     Dental Advisory Given  Plan Discussed with: Anesthesiologist, CRNA and Surgeon  Anesthesia Plan Comments: (Patient consented for risks of anesthesia including but not limited to:  - adverse reactions to medications - damage to eyes, teeth, lips or other oral mucosa - nerve damage due to positioning  - sore throat or hoarseness - Damage to heart, brain, nerves, lungs, other parts of body or loss of life  Patient voiced understanding.)       Anesthesia Quick Evaluation

## 2023-03-27 NOTE — Interval H&P Note (Signed)
UROLOGY H&P UPDATE  Agree with prior H&P dated 03/11/2023, large left hydrocele and he opts for repair  Cardiac: RRR Lungs: CTA bilaterally  Laterality: Left Procedure: Left hydrocelectomy  Informed consent obtained, we specifically discussed the risks of bleeding, infection, post-operative pain, prolonged bruising/hematoma/swelling, recurrence, need for additional procedures.  Sondra Come, MD 03/27/2023

## 2023-03-27 NOTE — Transfer of Care (Signed)
Immediate Anesthesia Transfer of Care Note  Patient: Flint Etter  Procedure(s) Performed: HYDROCELECTOMY ADULT (Left)  Patient Location: PACU  Anesthesia Type:General  Level of Consciousness: awake and alert   Airway & Oxygen Therapy: Patient Spontanous Breathing  Post-op Assessment: Report given to RN and Post -op Vital signs reviewed and stable  Post vital signs: Reviewed and stable  Last Vitals:  Vitals Value Taken Time  BP 138/95 03/27/23 1345  Temp 36.1 C 03/27/23 1345  Pulse 64 03/27/23 1348  Resp 16 03/27/23 1348  SpO2 100 % 03/27/23 1348  Vitals shown include unvalidated device data.  Last Pain:  Vitals:   03/27/23 1345  PainSc: 0-No pain         Complications: No notable events documented.

## 2023-03-27 NOTE — Discharge Instructions (Signed)

## 2023-03-27 NOTE — Op Note (Signed)
Date of procedure: 03/27/23  Preoperative diagnosis:  Left hydrocele  Postoperative diagnosis:  Same  Procedure: Left hydrocelectomy  Surgeon: Legrand Rams, MD  Anesthesia: General  Complications: None  Intraoperative findings:  Uncomplicated left hydrocelectomy  EBL: Minimal  Specimens: None  Drains: None  Indication: Joel Ramirez is a 62 y.o. patient with large left hydrocele that has become increasingly bothersome and he desired treatment.  After reviewing the management options for treatment, they elected to proceed with the above surgical procedure(s). We have discussed the potential benefits and risks of the procedure, side effects of the proposed treatment, the likelihood of the patient achieving the goals of the procedure, and any potential problems that might occur during the procedure or recuperation. Informed consent has been obtained.  Description of procedure:  The patient was taken to the operating room and general anesthesia was induced. SCDs were placed for DVT prophylaxis. The patient was placed in the supine position, prepped and draped in the usual sterile fashion, and preoperative antibiotics(Ancef) were administered. A preoperative time-out was performed.   A 5 cm incision was made over the left hemiscrotum and dissected down through the dartos.  Blunt dissection was used to free up the hydrocele and testicle circumferentially, and it was delivered into the field. The tunica vaginalis was incised sharply with approximately 500 mL of straw-colored fluid.  The testicle was normal-appearing.  The tunica was inverted and a jaboulee repair with running Vicryl was performed.  Meticulous hemostasis was achieved in the scrotum.  The testicle was copiously irrigated and returned to the scrotum in anatomic position.  The dartos layer was closed with a running 2-0 Vicryl, the wound was irrigated again, and the skin was closed with a running 3-0 chromic.  10 mL of  lidocaine was injected alongside the incision.  Bacitracin, Telfa, fluffs, and mesh pants were applied.   Disposition: Stable to PACU  Plan: Follow-up in clinic in 8 weeks with PA for wound check  Legrand Rams, MD

## 2023-03-30 ENCOUNTER — Encounter: Payer: Self-pay | Admitting: Urology

## 2023-04-01 NOTE — Anesthesia Postprocedure Evaluation (Signed)
Anesthesia Post Note  Patient: Joel Ramirez  Procedure(s) Performed: HYDROCELECTOMY ADULT (Left)  Patient location during evaluation: PACU Anesthesia Type: General Level of consciousness: awake and oriented Pain management: pain level controlled Vital Signs Assessment: post-procedure vital signs reviewed and stable Respiratory status: spontaneous breathing Cardiovascular status: blood pressure returned to baseline Anesthetic complications: no   No notable events documented.   Last Vitals:  Vitals:   03/27/23 1447 03/27/23 1509  BP: (!) 155/85 (!) 144/88  Pulse: 75   Resp: 18   Temp: 37.1 C   SpO2: 100%     Last Pain:  Vitals:   03/30/23 1226  TempSrc:   PainSc: 0-No pain                 VAN STAVEREN,Clemencia Helzer

## 2023-05-05 DIAGNOSIS — M26621 Arthralgia of right temporomandibular joint: Secondary | ICD-10-CM | POA: Diagnosis not present

## 2023-05-05 DIAGNOSIS — H6123 Impacted cerumen, bilateral: Secondary | ICD-10-CM | POA: Diagnosis not present

## 2023-05-05 DIAGNOSIS — H9201 Otalgia, right ear: Secondary | ICD-10-CM | POA: Diagnosis not present

## 2023-05-22 ENCOUNTER — Encounter: Payer: Self-pay | Admitting: Physician Assistant

## 2023-05-22 ENCOUNTER — Ambulatory Visit (INDEPENDENT_AMBULATORY_CARE_PROVIDER_SITE_OTHER): Payer: 59 | Admitting: Physician Assistant

## 2023-05-22 VITALS — BP 134/86 | HR 70 | Ht 73.0 in | Wt 170.0 lb

## 2023-05-22 DIAGNOSIS — Z09 Encounter for follow-up examination after completed treatment for conditions other than malignant neoplasm: Secondary | ICD-10-CM

## 2023-05-22 DIAGNOSIS — N433 Hydrocele, unspecified: Secondary | ICD-10-CM

## 2023-05-22 DIAGNOSIS — Z87438 Personal history of other diseases of male genital organs: Secondary | ICD-10-CM

## 2023-05-22 NOTE — Progress Notes (Signed)
05/22/2023 10:51 AM   [name] 04-Oct-1961 [id number]  CC: Chief Complaint  Patient presents with   Follow-up    Wound check   HPI: Joel Ramirez is a 62 y.o. 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 Richardo Hanks on 03/27/2023 who presents today for postop wound check.  Today he reports no concerns.  He thinks his left hydrocele is significantly improved over prior.  He has some occasional twinges of discomfort at his surgical site, but overall is pleased.  PMH: Past Medical History:  Diagnosis Date   Actinic keratosis    Anemia    Arthritis    Cancer (HCC)    skin- sqamous   Dysplastic nevus 05/28/2020   Right mid medial thigh. Severe atypia, peripheral margin involved.   GERD (gastroesophageal reflux disease)    h/o   Hemorrhoids    History of alcohol abuse    Inguinal hernia     Surgical History: Past Surgical History:  Procedure Laterality Date   COLONOSCOPY WITH PROPOFOL N/A 06/29/2015   Procedure: COLONOSCOPY WITH PROPOFOL;  Surgeon: Elnita Maxwell, MD;  Location: [facility] ENDOSCOPY;  Service: Endoscopy;  Laterality: N/A;   HERNIA REPAIR Right 2023   inguinal   HYDROCELE EXCISION Left 03/27/2023   Procedure: HYDROCELECTOMY ADULT;  Surgeon: Sondra Come, MD;  Location: [facility] ORS;  Service: Urology;  Laterality: Left;   INGUINAL HERNIA REPAIR Bilateral 02/12/2016   Procedure: HERNIA REPAIR INGUINAL ADULT BILATERAL;  Surgeon: Nadeen Landau, MD;  Location: [facility] ORS;  Service: General;  Laterality: Bilateral;   INGUINAL HERNIA REPAIR Left 10/08/2017   Procedure: LAPAROSCOPIC INGUINAL HERNIA WITH MESH;  Surgeon: Nadeen Landau, MD;  Location: [facility] ORS;  Service: General;  Laterality: Left;   KNEE ARTHROSCOPY Left     Home Medications:  Allergies as of 05/22/2023       Reactions   Penicillins Rash   LAST HAD AS A CHILD AT AGE 81 Has patient had a PCN reaction causing immediate rash, facial/tongue/throat  swelling, SOB or lightheadedness with hypotension: Yes Has patient had a PCN reaction causing severe rash involving mucus membranes or skin necrosis: No Has patient had a PCN reaction that required hospitalization: Unknown Has patient had a PCN reaction occurring within the last 10 years: No If all of the above answers are "NO", then may proceed with Cephalosporin use.        Medication List        Accurate as of May 22, 2023 10:51 AM. If you have any questions, ask your nurse or doctor.          aspirin EC 325 MG tablet Take 650 mg by mouth daily as needed for moderate pain.   BENZOYL PEROXIDE EX Apply 1 application  topically daily as needed (acne).   ibuprofen 200 MG tablet Commonly known as: ADVIL Take 400 mg by mouth every 6 (six) hours as needed.   nicotine polacrilex 4 MG gum Commonly known as: NICORETTE Take 4 mg by mouth as needed for smoking cessation.   OVER THE COUNTER MEDICATION 1 tablet daily. THC Gummy   SAW PALMETTO PO Take 2 capsules by mouth daily as needed (prostate enlarged).        Allergies:  Allergies  Allergen Reactions   Penicillins Rash    LAST HAD AS A CHILD AT AGE 81 Has patient had a PCN reaction causing immediate rash, facial/tongue/throat swelling, SOB or lightheadedness with hypotension: Yes Has patient had a PCN  reaction causing severe rash involving mucus membranes or skin necrosis: No Has patient had a PCN reaction that required hospitalization: Unknown Has patient had a PCN reaction occurring within the last 10 years: No If all of the above answers are "NO", then may proceed with Cephalosporin use.     Family History: No family history on file.  Social History:   reports that he quit smoking about 20 years ago. His smoking use included cigarettes. He has a 20.00 pack-year smoking history. He has been exposed to tobacco smoke. He has never used smokeless tobacco. He reports current drug use. Frequency: 7.00 times per week.  Drug: Marijuana. He reports that he does not drink alcohol.  Physical Exam: BP 134/86   Pulse 70   Ht 6\' 1"  (1.854 m)   Wt 170 lb (77.1 kg)   BMI 22.43 kg/m   Constitutional:  Alert and oriented, no acute distress, nontoxic appearing HEENT: [place], AT Cardiovascular: No clubbing, cyanosis, or edema Respiratory: Normal respiratory effort, no increased work of breathing GU: Bilateral descended testicles.  Intact and well-healed left hydrocelectomy scar with no skin breaks or drainage.  Nontender bilateral epididymides. Skin: No rashes, bruises or suspicious lesions Neurologic: Grossly intact, no focal deficits, moving all 4 extremities Psychiatric: Normal mood and affect  Assessment & Plan:   1. Left hydrocele Well-healed s/p hydrocelectomy.  No acute concerns.  Okay to follow-up as needed.  Return if symptoms worsen or fail to improve.  Carman Ching, PA-C  [facility] [place] [address] [place], [place] [address] [phone]

## 2023-06-02 DIAGNOSIS — I10 Essential (primary) hypertension: Secondary | ICD-10-CM | POA: Diagnosis not present

## 2023-06-02 DIAGNOSIS — K219 Gastro-esophageal reflux disease without esophagitis: Secondary | ICD-10-CM | POA: Diagnosis not present

## 2023-06-02 DIAGNOSIS — Z87892 Personal history of anaphylaxis: Secondary | ICD-10-CM | POA: Diagnosis not present

## 2023-06-02 DIAGNOSIS — Z87891 Personal history of nicotine dependence: Secondary | ICD-10-CM | POA: Diagnosis not present

## 2023-06-02 DIAGNOSIS — M1990 Unspecified osteoarthritis, unspecified site: Secondary | ICD-10-CM | POA: Diagnosis not present

## 2023-06-02 DIAGNOSIS — Z85828 Personal history of other malignant neoplasm of skin: Secondary | ICD-10-CM | POA: Diagnosis not present

## 2023-06-02 DIAGNOSIS — Z880 Allergy status to penicillin: Secondary | ICD-10-CM | POA: Diagnosis not present

## 2023-06-02 DIAGNOSIS — R6884 Jaw pain: Secondary | ICD-10-CM | POA: Diagnosis not present

## 2023-06-22 DIAGNOSIS — H9201 Otalgia, right ear: Secondary | ICD-10-CM | POA: Diagnosis not present

## 2023-06-22 DIAGNOSIS — M26621 Arthralgia of right temporomandibular joint: Secondary | ICD-10-CM | POA: Diagnosis not present

## 2023-06-25 ENCOUNTER — Encounter: Payer: Self-pay | Admitting: Dermatology

## 2023-06-25 ENCOUNTER — Ambulatory Visit (INDEPENDENT_AMBULATORY_CARE_PROVIDER_SITE_OTHER): Payer: 59 | Admitting: Dermatology

## 2023-06-25 VITALS — BP 131/84 | HR 70

## 2023-06-25 DIAGNOSIS — L814 Other melanin hyperpigmentation: Secondary | ICD-10-CM

## 2023-06-25 DIAGNOSIS — Z1283 Encounter for screening for malignant neoplasm of skin: Secondary | ICD-10-CM | POA: Diagnosis not present

## 2023-06-25 DIAGNOSIS — L988 Other specified disorders of the skin and subcutaneous tissue: Secondary | ICD-10-CM

## 2023-06-25 DIAGNOSIS — W908XXA Exposure to other nonionizing radiation, initial encounter: Secondary | ICD-10-CM

## 2023-06-25 DIAGNOSIS — L821 Other seborrheic keratosis: Secondary | ICD-10-CM

## 2023-06-25 DIAGNOSIS — Z872 Personal history of diseases of the skin and subcutaneous tissue: Secondary | ICD-10-CM | POA: Diagnosis not present

## 2023-06-25 DIAGNOSIS — I8393 Asymptomatic varicose veins of bilateral lower extremities: Secondary | ICD-10-CM

## 2023-06-25 DIAGNOSIS — L570 Actinic keratosis: Secondary | ICD-10-CM | POA: Diagnosis not present

## 2023-06-25 DIAGNOSIS — L578 Other skin changes due to chronic exposure to nonionizing radiation: Secondary | ICD-10-CM

## 2023-06-25 DIAGNOSIS — Z86018 Personal history of other benign neoplasm: Secondary | ICD-10-CM

## 2023-06-25 DIAGNOSIS — D1801 Hemangioma of skin and subcutaneous tissue: Secondary | ICD-10-CM | POA: Diagnosis not present

## 2023-06-25 DIAGNOSIS — D229 Melanocytic nevi, unspecified: Secondary | ICD-10-CM

## 2023-06-25 NOTE — Patient Instructions (Addendum)
Cryotherapy Aftercare  Wash gently with soap and water everyday.   Apply Vaseline Jelly daily until healed.    Recommend daily broad spectrum sunscreen SPF 30+ to sun-exposed areas, reapply every 2 hours as needed. Call for new or changing lesions.  Staying in the shade or wearing long sleeves, sun glasses (UVA+UVB protection) and wide brim hats (4-inch brim around the entire circumference of the hat) are also recommended for sun protection.    Melanoma ABCDEs  Melanoma is the most dangerous type of skin cancer, and is the leading cause of death from skin disease.  You are more likely to develop melanoma if you: Have light-colored skin, light-colored eyes, or red or blond hair Spend a lot of time in the sun Tan regularly, either outdoors or in a tanning bed Have had blistering sunburns, especially during childhood Have a close family member who has had a melanoma Have atypical moles or large birthmarks  Early detection of melanoma is key since treatment is typically straightforward and cure rates are extremely high if we catch it early.   The first sign of melanoma is often a change in a mole or a new dark spot.  The ABCDE system is a way of remembering the signs of melanoma.  A for asymmetry:  The two halves do not match. B for border:  The edges of the growth are irregular. C for color:  A mixture of colors are present instead of an even brown color. D for diameter:  Melanomas are usually (but not always) greater than 6mm - the size of a pencil eraser. E for evolution:  The spot keeps changing in size, shape, and color.  Please check your skin once per month between visits. You can use a small mirror in front and a large mirror behind you to keep an eye on the back side or your body.   If you see any new or changing lesions before your next follow-up, please call to schedule a visit.  Please continue daily skin protection including broad spectrum sunscreen SPF 30+ to sun-exposed  areas, reapplying every 2 hours as needed when you're outdoors.   Staying in the shade or wearing long sleeves, sun glasses (UVA+UVB protection) and wide brim hats (4-inch brim around the entire circumference of the hat) are also recommended for sun protection.    Due to recent changes in healthcare laws, you may see results of your pathology and/or laboratory studies on MyChart before the doctors have had a chance to review them. We understand that in some cases there may be results that are confusing or concerning to you. Please understand that not all results are received at the same time and often the doctors may need to interpret multiple results in order to provide you with the best plan of care or course of treatment. Therefore, we ask that you please give us 2 business days to thoroughly review all your results before contacting the office for clarification. Should we see a critical lab result, you will be contacted sooner.   If You Need Anything After Your Visit  If you have any questions or concerns for your doctor, please call our main line at [phone] and press option 4 to reach your doctor's medical assistant. If no one answers, please leave a voicemail as directed and we will return your call as soon as possible. Messages left after 4 pm will be answered the following business day.   You may also send us a message via MyChart.   We typically respond to MyChart messages within 1-2 business days.  For prescription refills, please ask your pharmacy to contact our office. Our fax number is [phone].  If you have an urgent issue when the clinic is closed that cannot wait until the next business day, you can page your doctor at the number below.    Please note that while we do our best to be available for urgent issues outside of office hours, we are not available 24/7.   If you have an urgent issue and are unable to reach us, you may choose to seek medical care at your doctor's  office, retail clinic, urgent care center, or emergency room.  If you have a medical emergency, please immediately call 911 or go to the emergency department.  Pager Numbers  - Dr. Kowalski: [phone]  - Dr. Moye: [phone]  - Dr. Stewart: [phone]  In the event of inclement weather, please call our main line at [phone] for an update on the status of any delays or closures.  Dermatology Medication Tips: Please keep the boxes that topical medications come in in order to help keep track of the instructions about where and how to use these. Pharmacies typically print the medication instructions only on the boxes and not directly on the medication tubes.   If your medication is too expensive, please contact our office at [phone] option 4 or send us a message through MyChart.   We are unable to tell what your co-pay for medications will be in advance as this is different depending on your insurance coverage. However, we may be able to find a substitute medication at lower cost or fill out paperwork to get insurance to cover a needed medication.   If a prior authorization is required to get your medication covered by your insurance company, please allow us 1-2 business days to complete this process.  Drug prices often vary depending on where the prescription is filled and some pharmacies may offer cheaper prices.  The website www.goodrx.com contains coupons for medications through different pharmacies. The prices here do not account for what the cost may be with help from insurance (it may be cheaper with your insurance), but the website can give you the price if you did not use any insurance.  - You can print the associated coupon and take it with your prescription to the pharmacy.  - You may also stop by our office during regular business hours and pick up a GoodRx coupon card.  - If you need your prescription sent electronically to a different pharmacy, notify our office  through [place] MyChart or by phone at [phone] option 4.     Si Usted Necesita Algo Despus de Su Visita  Tambin puede enviarnos un mensaje a travs de MyChart. Por lo general respondemos a los mensajes de MyChart en el transcurso de 1 a 2 das hbiles.  Para renovar recetas, por favor pida a su farmacia que se ponga en contacto con nuestra oficina. Nuestro nmero de fax es el [phone].  Si tiene un asunto urgente cuando la clnica est cerrada y que no puede esperar hasta el siguiente da hbil, puede llamar/localizar a su doctor(a) al nmero que aparece a continuacin.   Por favor, tenga en cuenta que aunque hacemos todo lo posible para estar disponibles para asuntos urgentes fuera del horario de oficina, no estamos disponibles las 24 horas del da, los 7 das de la semana.   Si tiene un problema urgente y no puede   comunicarse con nosotros, puede optar por buscar atencin mdica  en el consultorio de su doctor(a), en una clnica privada, en un centro de atencin urgente o en una sala de emergencias.  Si tiene una emergencia mdica, por favor llame inmediatamente al 911 o vaya a la sala de emergencias.  Nmeros de bper  - Dr. Kowalski: [phone]  - Dra. Moye: [phone]  - Dra. Stewart: [phone]  En caso de inclemencias del tiempo, por favor llame a nuestra lnea principal al [phone] para una actualizacin sobre el estado de cualquier retraso o cierre.  Consejos para la medicacin en dermatologa: Por favor, guarde las cajas en las que vienen los medicamentos de uso tpico para ayudarle a seguir las instrucciones sobre dnde y cmo usarlos. Las farmacias generalmente imprimen las instrucciones del medicamento slo en las cajas y no directamente en los tubos del medicamento.   Si su medicamento es muy caro, por favor, pngase en contacto con nuestra oficina llamando al [phone] y presione la opcin 4 o envenos un mensaje a travs de MyChart.   No  podemos decirle cul ser su copago por los medicamentos por adelantado ya que esto es diferente dependiendo de la cobertura de su seguro. Sin embargo, es posible que podamos encontrar un medicamento sustituto a menor costo o llenar un formulario para que el seguro cubra el medicamento que se considera necesario.   Si se requiere una autorizacin previa para que su compaa de seguros cubra su medicamento, por favor permtanos de 1 a 2 das hbiles para completar este proceso.  Los precios de los medicamentos varan con frecuencia dependiendo del lugar de dnde se surte la receta y alguna farmacias pueden ofrecer precios ms baratos.  El sitio web www.goodrx.com tiene cupones para medicamentos de diferentes farmacias. Los precios aqu no tienen en cuenta lo que podra costar con la ayuda del seguro (puede ser ms barato con su seguro), pero el sitio web puede darle el precio si no utiliz ningn seguro.  - Puede imprimir el cupn correspondiente y llevarlo con su receta a la farmacia.  - Tambin puede pasar por nuestra oficina durante el horario de atencin regular y recoger una tarjeta de cupones de GoodRx.  - Si necesita que su receta se enve electrnicamente a una farmacia diferente, informe a nuestra oficina a travs de MyChart de [place] o por telfono llamando al [phone] y presione la opcin 4.  

## 2023-06-25 NOTE — Progress Notes (Signed)
Follow-Up Visit   Subjective  Joel Ramirez is a 62 y.o. male who presents for the following: Skin Cancer Screening and Full Body Skin Exam. Hx of Aks. Hx of dysplastic nevus.   The patient presents for Total-Body Skin Exam (TBSE) for skin cancer screening and mole check. The patient has spots, moles and lesions to be evaluated, some may be new or changing and the patient may have concern these could be cancer.    The following portions of the chart were reviewed this encounter and updated as appropriate: medications, allergies, medical history  Review of Systems:  No other skin or systemic complaints except as noted in HPI or Assessment and Plan.  Objective  Well appearing patient in no apparent distress; mood and affect are within normal limits.  A full examination was performed including scalp, head, eyes, ears, nose, lips, neck, chest, axillae, abdomen, back, buttocks, bilateral upper extremities, bilateral lower extremities, hands, feet, fingers, toes, fingernails, and toenails. All findings within normal limits unless otherwise noted below.   Relevant physical exam findings are noted in the Assessment and Plan.  Scalp x3, and face x1 (4) Erythematous thin papules/macules with gritty scale.     Assessment & Plan   HISTORY OF DYSPLASTIC NEVUS. Right mid medial thigh. Severe atypia, peripheral margin involved. 05/28/2020.  No evidence of recurrence today Recommend regular full body skin exams Recommend daily broad spectrum sunscreen SPF 30+ to sun-exposed areas, reapply every 2 hours as needed.  Call if any new or changing lesions are noted between office visits   SKIN CANCER SCREENING PERFORMED TODAY.    LENTIGINES, SEBORRHEIC KERATOSES, HEMANGIOMAS - Benign normal skin lesions - Benign-appearing - Call for any changes  MELANOCYTIC NEVI - Tan-brown and/or pink-flesh-colored symmetric macules and papules - Benign appearing on exam today - Observation - Call clinic  for new or changing moles - Recommend daily use of broad spectrum spf 30+ sunscreen to sun-exposed areas.    Varicose Veins/Spider Veins - Dilated blue, purple or red veins at the lower extremities - Reassured - Smaller vessels can be treated by sclerotherapy (a procedure to inject a medicine into the veins to make them disappear) if desired, but the treatment is not covered by insurance. Larger vessels may be covered if symptomatic and we would refer to vascular surgeon if treatment desired.  [place] Exam: red or purple papule at left neck  Treatment Plan: Benign-appearing. Observe    AK (actinic keratosis) (4) Scalp x3, and face x1  Actinic keratoses are precancerous spots that appear secondary to cumulative UV radiation exposure/sun exposure over time. They are chronic with expected duration over 1 year. A portion of actinic keratoses will progress to squamous cell carcinoma of the skin. It is not possible to reliably predict which spots will progress to skin cancer and so treatment is recommended to prevent development of skin cancer.  Recommend daily broad spectrum sunscreen SPF 30+ to sun-exposed areas, reapply every 2 hours as needed.  Recommend staying in the shade or wearing long sleeves, sun glasses (UVA+UVB protection) and wide brim hats (4-inch brim around the entire circumference of the hat). Call for new or changing lesions.  ACTINIC DAMAGE - Chronic condition, secondary to cumulative UV/sun exposure - diffuse scaly erythematous macules with underlying dyspigmentation - Recommend daily broad spectrum sunscreen SPF 30+ to sun-exposed areas, reapply every 2 hours as needed.  - Staying in the shade or wearing long sleeves, sun glasses (UVA+UVB protection) and wide brim hats (4-inch brim around  the entire circumference of the hat) are also recommended for sun protection.  - Call for new or changing lesions.  Destruction of lesion - Scalp x3, and face x1 (4) Complexity:  simple   Destruction method: cryotherapy   Informed consent: discussed and consent obtained   Timeout:  patient name, date of birth, surgical site, and procedure verified Lesion destroyed using liquid nitrogen: Yes   Region frozen until ice ball extended beyond lesion: Yes   Outcome: patient tolerated procedure well with no complications   Post-procedure details: wound care instructions given   Additional details:  Prior to procedure, discussed risks of blister formation, small wound, skin dyspigmentation, or rare scar following cryotherapy. Recommend Vaseline ointment to treated areas while healing.    Return in about 1 year (around 06/24/2024) for TBSE, HxDN, HxAKs.  I, Lawson Radar, CMA, am acting as scribe for Armida Sans, MD.   Documentation: I have reviewed the above documentation for accuracy and completeness, and I agree with the above.  Armida Sans, MD

## 2023-06-26 ENCOUNTER — Encounter: Payer: Self-pay | Admitting: Dermatology

## 2023-08-26 ENCOUNTER — Telehealth: Payer: Self-pay

## 2023-08-26 NOTE — Telephone Encounter (Signed)
I spoke with pt; and confirmed est care appt on 08/28/23 at 10:20. Pt voiced understanding and nothing further needed. Pt will keep appt and be at [facility] 15 mins early to get cked in.

## 2023-08-28 ENCOUNTER — Ambulatory Visit (INDEPENDENT_AMBULATORY_CARE_PROVIDER_SITE_OTHER): Payer: 59 | Admitting: Nurse Practitioner

## 2023-08-28 ENCOUNTER — Encounter: Payer: Self-pay | Admitting: Nurse Practitioner

## 2023-08-28 VITALS — BP 120/78 | HR 79 | Temp 98.1°F | Ht 73.0 in | Wt 155.4 lb

## 2023-08-28 DIAGNOSIS — Z125 Encounter for screening for malignant neoplasm of prostate: Secondary | ICD-10-CM | POA: Diagnosis not present

## 2023-08-28 DIAGNOSIS — Z0000 Encounter for general adult medical examination without abnormal findings: Secondary | ICD-10-CM | POA: Insufficient documentation

## 2023-08-28 DIAGNOSIS — K6289 Other specified diseases of anus and rectum: Secondary | ICD-10-CM

## 2023-08-28 DIAGNOSIS — F1011 Alcohol abuse, in remission: Secondary | ICD-10-CM

## 2023-08-28 DIAGNOSIS — Z13220 Encounter for screening for lipoid disorders: Secondary | ICD-10-CM

## 2023-08-28 DIAGNOSIS — Z7689 Persons encountering health services in other specified circumstances: Secondary | ICD-10-CM | POA: Insufficient documentation

## 2023-08-28 DIAGNOSIS — Z23 Encounter for immunization: Secondary | ICD-10-CM

## 2023-08-28 DIAGNOSIS — Z1211 Encounter for screening for malignant neoplasm of colon: Secondary | ICD-10-CM | POA: Insufficient documentation

## 2023-08-28 LAB — COMPREHENSIVE METABOLIC PANEL
ALT: 26 U/L (ref 0–53)
AST: 26 U/L (ref 0–37)
Albumin: 4.4 g/dL (ref 3.5–5.2)
Alkaline Phosphatase: 60 U/L (ref 39–117)
BUN: 15 mg/dL (ref 6–23)
CO2: 33 meq/L — ABNORMAL HIGH (ref 19–32)
Calcium: 9.5 mg/dL (ref 8.4–10.5)
Chloride: 102 meq/L (ref 96–112)
Creatinine, Ser: 0.93 mg/dL (ref 0.40–1.50)
GFR: 89.7 mL/min (ref >60.00)
Glucose, Bld: 84 mg/dL (ref 70–99)
Potassium: 4.4 meq/L (ref 3.5–5.1)
Sodium: 141 meq/L (ref 135–145)
Total Bilirubin: 0.9 mg/dL (ref 0.2–1.2)
Total Protein: 7.1 g/dL (ref 6.0–8.3)

## 2023-08-28 LAB — CBC
HCT: 41.1 % (ref 39.0–52.0)
Hemoglobin: 13.5 g/dL (ref 13.0–17.0)
MCHC: 32.8 g/dL (ref 30.0–36.0)
MCV: 90.7 fl (ref 78.0–100.0)
Platelets: 189 10*3/uL (ref 150.0–400.0)
RBC: 4.53 Mil/uL (ref 4.22–5.81)
RDW: 13.7 % (ref 11.5–15.5)
WBC: 4.1 10*3/uL (ref 4.0–10.5)

## 2023-08-28 LAB — LIPID PANEL
Cholesterol: 156 mg/dL (ref 0–200)
HDL: 52.8 mg/dL (ref >39.00)
LDL Cholesterol: 90 mg/dL (ref 0–99)
NonHDL: 103
Total CHOL/HDL Ratio: 3
Triglycerides: 63 mg/dL (ref 0.0–149.0)
VLDL: 12.6 mg/dL (ref 0.0–40.0)

## 2023-08-28 LAB — PSA: PSA: 1.79 ng/mL (ref 0.10–4.00)

## 2023-08-28 MED ORDER — HYDROCORTISONE ACETATE 25 MG RE SUPP
25.0000 mg | Freq: Two times a day (BID) | RECTAL | 0 refills | Status: DC
Start: 2023-08-28 — End: 2023-09-24

## 2023-08-28 NOTE — Assessment & Plan Note (Addendum)
History of internal hemorrhoids visualized on colonoscopy.  Rectal exam within normal limits.  Will treat with Anusol 2.5% 1 suppository twice daily for a week.  If no improvement needs referral to GI. Given dx will check basic labs to make sure blood counts are ok

## 2023-08-28 NOTE — Assessment & Plan Note (Addendum)
Patient has been sober since April 2023.  He does attend AA meetings. Will check patients CBC and CMP

## 2023-08-28 NOTE — Progress Notes (Signed)
New Patient Office Visit  Subjective    Patient ID: Joel Ramirez, male    DOB: Nov 04, 1961  Age: 62 y.o. MRN: [id number]  CC:  Chief Complaint  Patient presents with   Establish Care    Pt complains of hemorrhoid problem going on for 3 weeks. Pain level 9 today,      HPI Joel Ramirez presents to establish care  Rectal pain: states that it has been present for approx 3 weeks.states that it has worsened per his report. States that he has used preparation h and witch hazel. . States that he is getting some relief but temporary. States no blood or itching .  States hurts when he is sitting still. States that he has been using a donut pillow.  Does not feel any on the outside.  States that he was 200 pounds and intentionally lost weight. Sttes that he was eating meat and vegetables.   Dermatology: once a year. Sees Armida Sans, MD  Immunizations: -Tetanus: Completed in 2023 -Influenza: refused  -Shingles: discussed  -Pneumonia: too young  Colonoscopy: Completed in 2016, recall 5 years. Over due   PSA: Due     Outpatient Encounter Medications as of 08/28/2023  Medication Sig   aspirin EC 325 MG tablet Take 650 mg by mouth daily as needed for moderate pain.   BENZOYL PEROXIDE EX Apply 1 application  topically daily as needed (acne).   hydrocortisone (ANUSOL-HC) 25 MG suppository Place 1 suppository (25 mg total) rectally 2 (two) times daily.   ibuprofen (ADVIL) 200 MG tablet Take 400 mg by mouth every 6 (six) hours as needed.   meloxicam (MOBIC) 15 MG tablet Take 15 mg by mouth daily.   nicotine polacrilex (NICORETTE) 4 MG gum Take 4 mg by mouth as needed for smoking cessation.   OVER THE COUNTER MEDICATION 1 tablet daily. THC Gummy   Saw Palmetto, Serenoa repens, (SAW PALMETTO PO) Take 2 capsules by mouth daily as needed (prostate enlarged).   No facility-administered encounter medications on file as of 08/28/2023.    Past Medical History:  Diagnosis Date    Actinic keratosis    Anemia    Arthritis    Cancer (HCC)    skin- sqamous   Dysplastic nevus 05/28/2020   Right mid medial thigh. Severe atypia, peripheral margin involved.   GERD (gastroesophageal reflux disease)    h/o   Hemorrhoids    History of alcohol abuse    Inguinal hernia     Past Surgical History:  Procedure Laterality Date   COLONOSCOPY WITH PROPOFOL N/A 06/29/2015   Procedure: COLONOSCOPY WITH PROPOFOL;  Surgeon: Elnita Maxwell, MD;  Location: [facility] ENDOSCOPY;  Service: Endoscopy;  Laterality: N/A;   HERNIA REPAIR Right 2023   inguinal   HYDROCELE EXCISION Left 03/27/2023   Procedure: HYDROCELECTOMY ADULT;  Surgeon: Sondra Come, MD;  Location: [facility] ORS;  Service: Urology;  Laterality: Left;   INGUINAL HERNIA REPAIR Bilateral 02/12/2016   Procedure: HERNIA REPAIR INGUINAL ADULT BILATERAL;  Surgeon: Nadeen Landau, MD;  Location: [facility] ORS;  Service: General;  Laterality: Bilateral;   INGUINAL HERNIA REPAIR Left 10/08/2017   Procedure: LAPAROSCOPIC INGUINAL HERNIA WITH MESH;  Surgeon: Nadeen Landau, MD;  Location: [facility] ORS;  Service: General;  Laterality: Left;   KNEE ARTHROSCOPY Left     Family History  Problem Relation Age of Onset   Bipolar disorder Mother     Social History   Socioeconomic History   Marital status: Single  Spouse name: Not on file   Number of children: Not on file   Years of education: Not on file   Highest education level: Not on file  Occupational History   Not on file  Tobacco Use   Smoking status: Former    Current packs/day: 0.00    Average packs/day: 1 pack/day for 20.0 years (20.0 ttl pk-yrs)    Types: Cigarettes    Start date: 02/04/1983    Quit date: 02/04/2003    Years since quitting: 20.5    Passive exposure: Past   Smokeless tobacco: Never  Vaping Use   Vaping status: Former  Substance and Sexual Activity   Alcohol use: No    Comment: PT IS  AN ALCOHOLIC-LAST HAD ALCOHOL June 2023   Drug use: Yes     Frequency: 7.0 times per week    Types: Marijuana    Comment: Marijuana/thc gummies   Sexual activity: Yes    Birth control/protection: None  Other Topics Concern   Not on file  Social History Narrative   Lives alone   Social Determinants of Health   Financial Resource Strain: Medium Risk (11/19/2022)   Received from [facility], [facility]   Overall Financial Resource Strain (CARDIA)    Difficulty of Paying Living Expenses: Somewhat hard  Food Insecurity: No Food Insecurity (11/19/2022)   Received from [facility], [facility]   Hunger Vital Sign    Worried About Running Out of Food in the Last Year: Never true    Ran Out of Food in the Last Year: Never true  Transportation Needs: No Transportation Needs (11/19/2022)   Received from [facility], [facility]   [facility] - Transportation    In the past 12 months, has lack of transportation kept you from medical appointments or from getting medications?: No    Lack of Transportation (Non-Medical): No  Physical Activity: Not on file  Stress: Not on file  Social Connections: Not on file  Intimate Partner Violence: Not on file    Review of Systems  Constitutional:  Negative for chills and fever.  Respiratory:  Negative for shortness of breath.   Cardiovascular:  Negative for chest pain.  Gastrointestinal:  Negative for abdominal pain, constipation, diarrhea, nausea and vomiting.       "+: rectal pain   Neurological:  Negative for headaches.  Psychiatric/Behavioral:  Negative for hallucinations and suicidal ideas.         Objective    BP 120/78   Pulse 79   Temp 98.1 F (36.7 C) (Temporal)   Ht 6\' 1"  (1.854 m)   Wt 155 lb 6.4 oz (70.5 kg)   SpO2 99%   BMI 20.50 kg/m   Physical Exam Vitals and nursing note reviewed. Exam conducted with a chaperone present Melina Copa, CMA).  Constitutional:       Appearance: Normal appearance.  HENT:     Right Ear: Tympanic membrane, ear canal and external ear normal.     Left Ear: Tympanic membrane, ear canal and external ear normal.     Mouth/Throat:     Mouth: Mucous membranes are moist.     Pharynx: Oropharynx is clear.  Eyes:     Extraocular Movements: Extraocular movements intact.     Pupils: Pupils are equal, round, and reactive to light.  Cardiovascular:     Rate and Rhythm: Normal rate and regular rhythm.     Pulses: Normal pulses.  Heart sounds: Normal heart sounds.  Pulmonary:     Effort: Pulmonary effort is normal.     Breath sounds: Normal breath sounds.  Genitourinary:    Rectum: Normal.  Musculoskeletal:     Right lower leg: No edema.     Left lower leg: No edema.  Lymphadenopathy:     Cervical: No cervical adenopathy.  Skin:    General: Skin is warm.  Neurological:     General: No focal deficit present.     Mental Status: He is alert.     Deep Tendon Reflexes:     Reflex Scores:      Bicep reflexes are 2+ on the right side and 2+ on the left side.      Patellar reflexes are 2+ on the right side and 2+ on the left side.    Comments: Bilateral upper and lower extremity strength 5/5  Psychiatric:        Mood and Affect: Mood normal.        Behavior: Behavior normal.        Thought Content: Thought content normal.        Judgment: Judgment normal.         Assessment & Plan:   Problem List Items Addressed This Visit       Other   History of alcohol abuse    Patient has been sober since April 2023.  He does attend AA meetings.      Encounter to establish care - Primary   Relevant Orders   CBC   Comprehensive metabolic panel   Rectal pain    History of internal hemorrhoids visualized on colonoscopy.  Rectal exam within normal limits.  Will treat with Anusol 2.5% 1 suppository twice daily for a week.  If no improvement needs referral to GI      Relevant Medications   hydrocortisone (ANUSOL-HC) 25 MG  suppository   Other Visit Diagnoses     Screening for lipid disorders       Relevant Orders   Lipid panel   Screening for prostate cancer       Relevant Orders   PSA   Screening for colon cancer       Relevant Orders   Ambulatory referral to Gastroenterology   Need for influenza vaccination       Relevant Orders   Flu vaccine trivalent PF, 6mos and older(Flulaval,Afluria,Fluarix,Fluzone)       Return in about 6 months (around 02/25/2024) for CPE and Labs.   Audria Nine, NP

## 2023-09-01 ENCOUNTER — Telehealth: Payer: Self-pay

## 2023-09-01 NOTE — Telephone Encounter (Signed)
I called the patient to scheduled an appointment. I sent out an appointment reminder with a No-Show letter and a map also I informed him of his $10 copay.

## 2023-09-01 NOTE — Telephone Encounter (Signed)
Gastroenterology Pre-Procedure Review  Request Date: Not Due Requesting Physician: Dr.   PATIENT REVIEW QUESTIONS: The patient responded to the following health history questions as indicated:    1. Are you having any GI issues? yes (hemorrhoids last colonoscopy 06/29/2015 recommended repeat in 10 years hyperplastic polyps colonoscopy not due) 2. Do you have a personal history of Polyps? yes (last colonoscopy 06/29/2015 hyperplastic polyps) 3. Do you have a family history of Colon Cancer or Polyps? no 4. Diabetes Mellitus? no 5. Joint replacements in the past 12 months?no 6. Major health problems in the past 3 months? 03/27/23  left hydrocele, inguinal hernia 01/29/23 7. Any artificial heart valves, MVP, or defibrillator?no    MEDICATIONS & ALLERGIES:    Patient reports the following regarding taking any anticoagulation/antiplatelet therapy:   Plavix, Coumadin, Eliquis, Xarelto, Lovenox, Pradaxa, Brilinta, or Effient? no Aspirin? no  Patient confirms/reports the following medications:  Current Outpatient Medications  Medication Sig Dispense Refill   aspirin EC 325 MG tablet Take 650 mg by mouth daily as needed for moderate pain.     BENZOYL PEROXIDE EX Apply 1 application  topically daily as needed (acne).     hydrocortisone (ANUSOL-HC) 25 MG suppository Place 1 suppository (25 mg total) rectally 2 (two) times daily. 12 suppository 0   ibuprofen (ADVIL) 200 MG tablet Take 400 mg by mouth every 6 (six) hours as needed.     meloxicam (MOBIC) 15 MG tablet Take 15 mg by mouth daily.     nicotine polacrilex (NICORETTE) 4 MG gum Take 4 mg by mouth as needed for smoking cessation.     OVER THE COUNTER MEDICATION 1 tablet daily. THC Gummy     Saw Palmetto, Serenoa repens, (SAW PALMETTO PO) Take 2 capsules by mouth daily as needed (prostate enlarged).     No current facility-administered medications for this visit.    Patient confirms/reports the following allergies:  Allergies  Allergen  Reactions   Penicillins Rash    LAST HAD AS A CHILD AT AGE 62 Has patient had a PCN reaction causing immediate rash, facial/tongue/throat swelling, SOB or lightheadedness with hypotension: Yes Has patient had a PCN reaction causing severe rash involving mucus membranes or skin necrosis: No Has patient had a PCN reaction that required hospitalization: Unknown Has patient had a PCN reaction occurring within the last 10 years: No If all of the above answers are "NO", then may proceed with Cephalosporin use.     No orders of the defined types were placed in this encounter.   AUTHORIZATION INFORMATION Primary Insurance: 1D#: Group #:  Secondary Insurance: 1D#: Group #:  SCHEDULE INFORMATION: Date: Not Scheduled Time: Location: Not Scheduled

## 2023-09-08 ENCOUNTER — Telehealth: Payer: Self-pay

## 2023-09-08 NOTE — Telephone Encounter (Signed)
Called pt to triage his symptoms, pt states he wants to talk to [facility].I informed pt that I was Environmental manager and wanted to ask some question to better assistance him. Pt cut me off and stated he wanted to talk to [facility]. Pt was advise to send Sam a myChart message. Pt voiced understanding.

## 2023-09-08 NOTE — Telephone Encounter (Signed)
Pt calls triage line and c/o intermittent testicle pain and states he would like to speak directly with Carman Ching, PA. Advised patient that providers do not routinely answer phone calls and that I would relay any message or concerns to her. Pt denies fever, chills, or dysuria. Advised pt on supportive underwear, patient states he has tried supportive underwear with little relief and states he would like Sam's recommendations.

## 2023-09-08 NOTE — Telephone Encounter (Signed)
Please ask him to describe the pain. Any swelling? Which side? Is it near his hydrocelectomy scar? Is it new or has it been ongoing since surgery? Does anything make it better or worse?   I am not able to call the patient back. Options include sending me a MyChart message directly or scheduling an office visit.

## 2023-09-23 ENCOUNTER — Telehealth: Payer: Self-pay | Admitting: Nurse Practitioner

## 2023-09-23 DIAGNOSIS — K6289 Other specified diseases of anus and rectum: Secondary | ICD-10-CM

## 2023-09-23 NOTE — Telephone Encounter (Signed)
Prescription Request  09/23/2023  LOV: 08/28/2023  What is the name of the medication or equipment? hydrocortisone (ANUSOL-HC) 25 MG suppository   Have you contacted your pharmacy to request a refill? No   Which pharmacy would you like this sent to?  [facility] [address]), [place] - [address] [place]) [place] [address] Phone: [phone] Fax: [phone]    Patient notified that their request is being sent to the clinical staff for review and that they should receive a response within 2 business days.   Please advise at Mobile [phone] (mobile)  Patient has an appointment with GI scheduled, asked for a refill until appt on 10/29

## 2023-09-24 ENCOUNTER — Other Ambulatory Visit: Payer: Self-pay | Admitting: Nurse Practitioner

## 2023-09-24 DIAGNOSIS — K6289 Other specified diseases of anus and rectum: Secondary | ICD-10-CM

## 2023-09-24 MED ORDER — HYDROCORTISONE ACETATE 25 MG RE SUPP
25.0000 mg | Freq: Two times a day (BID) | RECTAL | 0 refills | Status: DC
Start: 2023-09-24 — End: 2023-10-14

## 2023-09-24 NOTE — Telephone Encounter (Signed)
Refill provided

## 2023-09-24 NOTE — Telephone Encounter (Signed)
LAST APPOINTMENT DATE: 08/28/2023   NEXT APPOINTMENT DATE: Visit date not found    LAST REFILL: 08/28/23  QTY: 12 suppositories 0RF      Patient has an appointment with GI scheduled, asked for a refill until appt on 10/29

## 2023-09-25 NOTE — Telephone Encounter (Signed)
Can we call and see if the pharmacy has an idea of what is covered by the insurance please

## 2023-10-06 ENCOUNTER — Ambulatory Visit: Payer: 59 | Admitting: Nurse Practitioner

## 2023-10-13 ENCOUNTER — Ambulatory Visit: Payer: 59 | Admitting: Gastroenterology

## 2023-10-13 ENCOUNTER — Telehealth: Payer: Self-pay

## 2023-10-13 ENCOUNTER — Other Ambulatory Visit: Payer: Self-pay

## 2023-10-13 DIAGNOSIS — K6289 Other specified diseases of anus and rectum: Secondary | ICD-10-CM

## 2023-10-13 DIAGNOSIS — Z1211 Encounter for screening for malignant neoplasm of colon: Secondary | ICD-10-CM

## 2023-10-13 NOTE — Telephone Encounter (Signed)
Dr. Tobi Bastos, after calling patient, I was able to cancel today's appointment and schedule his colonoscopy. However, his colonoscopy is until 11/11/2023 as it's your first available. But he stated that last month he was prescribed Anusol and it helped him with his hemorrhoids. He stated that since his appointment for today was cancelled, he wanted to know if you could prescribe him Anusol to help him for this month that he has to wait for him to have his colonoscopy. Please let me know if you are ok to refill.

## 2023-10-14 MED ORDER — HYDROCORTISONE ACETATE 25 MG RE SUPP: 25.0000 mg | Freq: Two times a day (BID) | RECTAL | 0 refills | Status: AC | PRN

## 2023-10-14 NOTE — Telephone Encounter (Signed)
Called patient back to let him know the below information and he stated that he appreciated it and understands not to use it more than 7 days due to causing dermatitis or other issues. Prescription will be sent to his pharmacy. Patient had no further questions.

## 2023-11-05 ENCOUNTER — Other Ambulatory Visit: Payer: Self-pay

## 2023-11-05 MED ORDER — NA SULFATE-K SULFATE-MG SULF 17.5-3.13-1.6 GM/177ML PO SOLN: 354.0000 mL | Freq: Once | ORAL | 0 refills | Status: AC

## 2023-11-11 ENCOUNTER — Other Ambulatory Visit: Payer: Self-pay

## 2023-11-11 ENCOUNTER — Encounter: Payer: Self-pay | Admitting: Gastroenterology

## 2023-11-11 ENCOUNTER — Ambulatory Visit
Admission: RE | Admit: 2023-11-11 | Discharge: 2023-11-11 | Disposition: A | Discharge status: Home / Self Care | Payer: Medicaid Other | Attending: Gastroenterology | Admitting: Gastroenterology

## 2023-11-11 ENCOUNTER — Ambulatory Visit: Payer: Medicaid Other

## 2023-11-11 ENCOUNTER — Ambulatory Visit: Payer: 59

## 2023-11-11 ENCOUNTER — Encounter
Admission: RE | Disposition: A | Discharge status: Home / Self Care | Payer: Self-pay | Source: Home / Self Care | Attending: Gastroenterology

## 2023-11-11 ENCOUNTER — Telehealth: Payer: Self-pay | Admitting: *Deleted

## 2023-11-11 DIAGNOSIS — Z87891 Personal history of nicotine dependence: Secondary | ICD-10-CM | POA: Diagnosis not present

## 2023-11-11 DIAGNOSIS — Z1211 Encounter for screening for malignant neoplasm of colon: Secondary | ICD-10-CM | POA: Diagnosis not present

## 2023-11-11 DIAGNOSIS — K5730 Diverticulosis of large intestine without perforation or abscess without bleeding: Secondary | ICD-10-CM | POA: Insufficient documentation

## 2023-11-11 DIAGNOSIS — K625 Hemorrhage of anus and rectum: Secondary | ICD-10-CM | POA: Insufficient documentation

## 2023-11-11 DIAGNOSIS — K640 First degree hemorrhoids: Secondary | ICD-10-CM | POA: Diagnosis not present

## 2023-11-11 DIAGNOSIS — K649 Unspecified hemorrhoids: Secondary | ICD-10-CM | POA: Diagnosis not present

## 2023-11-11 DIAGNOSIS — Z0000 Encounter for general adult medical examination without abnormal findings: Secondary | ICD-10-CM

## 2023-11-11 DIAGNOSIS — Z5986 Financial insecurity: Secondary | ICD-10-CM | POA: Diagnosis not present

## 2023-11-11 HISTORY — PX: COLONOSCOPY WITH PROPOFOL: SHX5780

## 2023-11-11 SURGERY — COLONOSCOPY WITH PROPOFOL
Anesthesia: General

## 2023-11-11 MED ORDER — SODIUM CHLORIDE 0.9 % IV SOLN: INTRAVENOUS | Status: DC

## 2023-11-11 MED ORDER — PROPOFOL 500 MG/50ML IV EMUL
INTRAVENOUS | Status: DC | PRN
  Administered 2023-11-11: 100 mg via INTRAVENOUS
  Administered 2023-11-11: 150 ug/kg/min via INTRAVENOUS

## 2023-11-11 NOTE — H&P (Signed)
Wyline Mood, MD [address], [place], [place], [address] [address], [address], [place], [place], [address] Phone: [phone]  Fax: [phone]  Primary Care Physician:  Eden Emms, NP   Pre-Procedure History & Physical: HPI:  Joel Ramirez is a 62 y.o. male is here for an colonoscopy.   Past Medical History:  Diagnosis Date   Actinic keratosis    Anemia    Arthritis    Cancer (HCC)    skin- sqamous   Dysplastic nevus 05/28/2020   Right mid medial thigh. Severe atypia, peripheral margin involved.   GERD (gastroesophageal reflux disease)    h/o   Hemorrhoids    History of alcohol abuse    Inguinal hernia     Past Surgical History:  Procedure Laterality Date   COLONOSCOPY WITH PROPOFOL N/A 06/29/2015   Procedure: COLONOSCOPY WITH PROPOFOL;  Surgeon: Elnita Maxwell, MD;  Location: [facility] ENDOSCOPY;  Service: Endoscopy;  Laterality: N/A;   HERNIA REPAIR Right 2023   inguinal   HYDROCELE EXCISION Left 03/27/2023   Procedure: HYDROCELECTOMY ADULT;  Surgeon: Sondra Come, MD;  Location: [facility] ORS;  Service: Urology;  Laterality: Left;   INGUINAL HERNIA REPAIR Bilateral 02/12/2016   Procedure: HERNIA REPAIR INGUINAL ADULT BILATERAL;  Surgeon: Nadeen Landau, MD;  Location: [facility] ORS;  Service: General;  Laterality: Bilateral;   INGUINAL HERNIA REPAIR Left 10/08/2017   Procedure: LAPAROSCOPIC INGUINAL HERNIA WITH MESH;  Surgeon: Nadeen Landau, MD;  Location: [facility] ORS;  Service: General;  Laterality: Left;   KNEE ARTHROSCOPY Left     Prior to Admission medications   Medication Sig Start Date End Date Taking? Authorizing Provider  aspirin EC 325 MG tablet Take 650 mg by mouth daily as needed for moderate pain.    [provider]  BENZOYL PEROXIDE EX Apply 1 application  topically daily as needed (acne).    [provider]  ibuprofen (ADVIL) 200 MG tablet Take 400 mg by mouth every 6 (six) hours as needed.    [provider]  meloxicam (MOBIC) 15 MG tablet Take 15 mg by mouth daily. 08/27/23   [provider]  nicotine polacrilex (NICORETTE) 4 MG gum Take 4 mg by mouth as needed for smoking cessation.    [provider]  OVER THE COUNTER MEDICATION 1 tablet daily. THC Gummy    [provider]  Saw Palmetto, Serenoa repens, (SAW PALMETTO PO) Take 2 capsules by mouth daily as needed (prostate enlarged).    [provider]    Allergies as of 10/13/2023 - Review Complete 08/28/2023  Allergen Reaction Noted   Penicillins Rash 06/28/2015    Family History  Problem Relation Age of Onset   Bipolar disorder Mother     Social History   Socioeconomic History   Marital status: Single    Spouse name: Not on file   Number of children: Not on file   Years of education: Not on file   Highest education level: Not on file  Occupational History   Not on file  Tobacco Use   Smoking status: Former    Current packs/day: 0.00    Average packs/day: 1 pack/day for 20.0 years (20.0 ttl pk-yrs)    Types: Cigarettes    Start date: 02/04/1983    Quit date: 02/04/2003    Years since quitting: 20.7    Passive exposure: Past   Smokeless tobacco: Never  Vaping Use   Vaping status: Former  Substance  and Sexual Activity   Alcohol use: No    Comment: PT IS  AN ALCOHOLIC-LAST HAD ALCOHOL June 2023   Drug use: Yes    Frequency: 7.0 times per week    Types: Marijuana    Comment: Marijuana/thc gummies   Sexual activity: Yes    Birth control/protection: None  Other Topics Concern   Not on file  Social History Narrative   Lives alone   Social Determinants of Health   Financial Resource Strain: Medium Risk (11/19/2022)   Received from [facility], [facility]   Overall Financial Resource Strain (CARDIA)    Difficulty of Paying Living Expenses: Somewhat hard  Food Insecurity: No Food Insecurity (11/19/2022)   Received from [facility], [facility]   Hunger Vital Sign    Worried About Running Out of Food in the Last Year: Never true    Ran Out of Food in the Last Year: Never true  Transportation Needs: No Transportation Needs (11/19/2022)   Received from [facility], [facility]   [facility] - Transportation    In the past 12 months, has lack of transportation kept you from medical appointments or from getting medications?: No    Lack of Transportation (Non-Medical): No  Physical Activity: Not on file  Stress: Not on file  Social Connections: Not on file  Intimate Partner Violence: Not on file    Review of Systems: See HPI, otherwise negative ROS  Physical Exam: There were no vitals taken for this visit. General:   Alert,  pleasant and cooperative in NAD Head:  Normocephalic and atraumatic. Neck:  Supple; no masses or thyromegaly. Lungs:  Clear throughout to auscultation, normal respiratory effort.    Heart:  +S1, +S2, Regular rate and rhythm, No edema. Abdomen:  Soft, nontender and nondistended. Normal bowel sounds, without guarding, and without rebound.   Neurologic:  Alert and  oriented x4;  grossly normal neurologically.  Impression/Plan: Joel Ramirez is here for an colonoscopy to be performed for Screening colonoscopy average risk   Risks, benefits, limitations, and alternatives regarding  colonoscopy have been reviewed with the patient.  Questions have been answered.  All parties agreeable.   Wyline Mood, MD  11/11/2023, 8:27 AM

## 2023-11-11 NOTE — Op Note (Signed)
[facility] Patient Name: Joel Ramirez Procedure Date: 11/11/2023 9:15 AM MRN: [id number] Account #: [id number] Date of Birth: May 03, 1961 Admit Type: Outpatient Age: 62 Room: [facility] ENDO ROOM 3 Gender: Male Note Status: Finalized Instrument Name: Prentice Docker [id number] Procedure:             Colonoscopy Indications:           Rectal bleeding Providers:             Wyline Mood MD, MD Referring MD:          Genene Churn. Toney Reil (Referring MD) Medicines:             Monitored Anesthesia Care Complications:         No immediate complications. Procedure:             Pre-Anesthesia Assessment:                        - Prior to the procedure, a History and Physical was                         performed, and patient medications, allergies and                         sensitivities were reviewed. The patient's tolerance                         of previous anesthesia was reviewed.                        - The risks and benefits of the procedure and the                         sedation options and risks were discussed with the                         patient. All questions were answered and informed                         consent was obtained.                        After obtaining informed consent, the colonoscope was                         passed under direct vision. Throughout the procedure,                         the patient's blood pressure, pulse, and oxygen                         saturations were monitored continuously. The                         Colonoscope was introduced through the anus and                         advanced to the the cecum, identified by the                         appendiceal orifice,  IC valve and transillumination.                         The colonoscopy was performed with ease. The patient                         tolerated the procedure well. The quality of the bowel                         preparation was excellent. The ileocecal  valve,                         appendiceal orifice, and rectum were photographed. Findings:      The perianal and digital rectal examinations were normal.      Multiple medium-mouthed diverticula were found in the sigmoid colon.      Non-bleeding internal hemorrhoids were found during retroflexion. The       hemorrhoids were medium-sized and Grade I (internal hemorrhoids that do       not prolapse).      The exam was otherwise without abnormality on direct and retroflexion       views. Impression:            - Diverticulosis in the sigmoid colon.                        - Non-bleeding internal hemorrhoids.                        - The examination was otherwise normal on direct and                         retroflexion views.                        - No specimens collected. Recommendation:        - Discharge patient to home (with escort).                        - Resume previous diet.                        - Continue present medications.                        - Repeat colonoscopy in 10 years for screening                         purposes.                        - Return to GI office as previously scheduled. Procedure Code(s):     --- Professional ---                        [phone], Colonoscopy, flexible; diagnostic, including                         collection of specimen(s) by brushing or washing, when                         performed (separate procedure) Diagnosis  Code(s):     --- Professional ---                        K64.0, First degree hemorrhoids                        K62.5, Hemorrhage of anus and rectum                        K57.30, Diverticulosis of large intestine without                         perforation or abscess without bleeding CPT copyright 2022 American Medical Association. All rights reserved. The codes documented in this report are preliminary and upon coder review may  be revised to meet current compliance requirements. Wyline Mood, MD Wyline Mood MD,  MD 11/11/2023 9:41:47 AM This report has been signed electronically. Number of Addenda: 0 Note Initiated On: 11/11/2023 9:15 AM Scope Withdrawal Time: 0 hours 7 minutes 35 seconds  Total Procedure Duration: 0 hours 10 minutes 53 seconds  Estimated Blood Loss:  Estimated blood loss: none.      [facility]

## 2023-11-11 NOTE — Anesthesia Postprocedure Evaluation (Signed)
Anesthesia Post Note  Patient: Joel Ramirez  Procedure(s) Performed: COLONOSCOPY WITH PROPOFOL  Patient location during evaluation: PACU Anesthesia Type: General Level of consciousness: awake Pain management: pain level controlled Vital Signs Assessment: post-procedure vital signs reviewed and stable Respiratory status: spontaneous breathing Cardiovascular status: blood pressure returned to baseline Anesthetic complications: no   There were no known notable events for this encounter.   Last Vitals:  Vitals:   11/11/23 0845 11/11/23 0941  BP: (!) 155/97 113/83  Pulse: 61 70  Resp: 18 15  Temp: (!) 36.2 C   SpO2: 100% 100%    Last Pain:  Vitals:   11/11/23 0941  TempSrc:   PainSc: 0-No pain                 VAN STAVEREN,Jasyn Mey

## 2023-11-11 NOTE — Addendum Note (Signed)
Addendum  created 11/11/23 1034 by Lysbeth Penner, CRNA   Flowsheet accepted

## 2023-11-11 NOTE — Anesthesia Preprocedure Evaluation (Signed)
Anesthesia Evaluation  Patient identified by MRN, date of birth, ID band Patient awake    Reviewed: Allergy & Precautions, NPO status , Patient's Chart, lab work & pertinent test results  Airway Mallampati: II  TM Distance: >3 FB Neck ROM: Full    Dental  (+) Teeth Intact   Pulmonary neg pulmonary ROS, former smoker   Pulmonary exam normal breath sounds clear to auscultation       Cardiovascular Exercise Tolerance: Good negative cardio ROS Normal cardiovascular exam Rhythm:Regular Rate:Normal     Neuro/Psych negative neurological ROS  negative psych ROS   GI/Hepatic negative GI ROS, Neg liver ROS,GERD  ,,  Endo/Other  negative endocrine ROS    Renal/GU negative Renal ROS  negative genitourinary   Musculoskeletal   Abdominal Normal abdominal exam  (+)   Peds  Hematology negative hematology ROS (+)   Anesthesia Other Findings Past Medical History: No date: Actinic keratosis No date: Anemia No date: Arthritis No date: Cancer (HCC)     Comment:  skin- sqamous 05/28/2020: Dysplastic nevus     Comment:  Right mid medial thigh. Severe atypia, peripheral margin              involved. No date: GERD (gastroesophageal reflux disease)     Comment:  h/o No date: Hemorrhoids No date: History of alcohol abuse No date: Inguinal hernia  Past Surgical History: 06/29/2015: COLONOSCOPY WITH PROPOFOL; N/A     Comment:  Procedure: COLONOSCOPY WITH PROPOFOL;  Surgeon: Elnita Maxwell, MD;  Location: [facility] ENDOSCOPY;  Service:               Endoscopy;  Laterality: N/A; 2023: HERNIA REPAIR; Right     Comment:  inguinal 03/27/2023: HYDROCELE EXCISION; Left     Comment:  Procedure: HYDROCELECTOMY ADULT;  Surgeon: Sondra Come, MD;  Location: [facility] ORS;  Service: Urology;                Laterality: Left; 02/12/2016: INGUINAL HERNIA REPAIR; Bilateral     Comment:  Procedure: HERNIA REPAIR  INGUINAL ADULT BILATERAL;                Surgeon: Nadeen Landau, MD;  Location: [facility] ORS;                Service: General;  Laterality: Bilateral; 10/08/2017: INGUINAL HERNIA REPAIR; Left     Comment:  Procedure: LAPAROSCOPIC INGUINAL HERNIA WITH MESH;                Surgeon: Nadeen Landau, MD;  Location: [facility] ORS;                Service: General;  Laterality: Left; No date: KNEE ARTHROSCOPY; Left  BMI    Body Mass Index: 21.11 kg/m      Reproductive/Obstetrics negative OB ROS                             Anesthesia Physical Anesthesia Plan  ASA: 2  Anesthesia Plan: General   Post-op Pain Management:    Induction: Intravenous  PONV Risk Score and Plan: Propofol infusion and TIVA  Airway Management Planned: Natural Airway  Additional Equipment:   Intra-op Plan:   Post-operative Plan:   Informed Consent: I have reviewed the patients History and Physical,  chart, labs and discussed the procedure including the risks, benefits and alternatives for the proposed anesthesia with the patient or authorized representative who has indicated his/her understanding and acceptance.     Dental Advisory Given  Plan Discussed with: CRNA and Surgeon  Anesthesia Plan Comments:        Anesthesia Quick Evaluation

## 2023-11-11 NOTE — Telephone Encounter (Signed)
Received message from Dr Tobi Bastos that patient is willing to schedule an appointment for banding.  Called patient and we have schedule patient's appointment on 01/11/2024.  Reminder letter will be sent.

## 2023-11-11 NOTE — Transfer of Care (Signed)
Immediate Anesthesia Transfer of Care Note  Patient: Joel Ramirez  Procedure(s) Performed: COLONOSCOPY WITH PROPOFOL  Patient Location: PACU  Anesthesia Type:General  Level of Consciousness: drowsy  Airway & Oxygen Therapy: Patient Spontanous Breathing  Post-op Assessment: Report given to RN and Post -op Vital signs reviewed and stable  Post vital signs: Reviewed and stable  Last Vitals:  Vitals Value Taken Time  BP 113/83 11/11/23 0941  Temp    Pulse 71 11/11/23 0942  Resp 14 11/11/23 0942  SpO2 100 % 11/11/23 0942  Vitals shown include unfiled device data.  Last Pain:  Vitals:   11/11/23 0941  TempSrc:   PainSc: 0-No pain         Complications: There were no known notable events for this encounter.

## 2023-11-16 ENCOUNTER — Encounter: Payer: Self-pay | Admitting: Gastroenterology

## 2023-11-24 ENCOUNTER — Other Ambulatory Visit: Payer: Self-pay

## 2023-11-24 MED ORDER — HYDROCORTISONE ACETATE 25 MG RE SUPP: 25.0000 mg | Freq: Two times a day (BID) | RECTAL | 0 refills | Status: AC

## 2023-12-30 ENCOUNTER — Encounter: Payer: Self-pay | Admitting: Nurse Practitioner

## 2023-12-30 ENCOUNTER — Ambulatory Visit: Payer: Medicaid Other | Admitting: Nurse Practitioner

## 2023-12-30 VITALS — BP 154/100 | HR 73 | Temp 98.2°F | Ht 73.0 in | Wt 174.8 lb

## 2023-12-30 DIAGNOSIS — R0789 Other chest pain: Secondary | ICD-10-CM | POA: Diagnosis not present

## 2023-12-30 DIAGNOSIS — I10 Essential (primary) hypertension: Secondary | ICD-10-CM | POA: Diagnosis not present

## 2023-12-30 MED ORDER — AMLODIPINE BESYLATE 5 MG PO TABS: 5.0000 mg | ORAL_TABLET | Freq: Every day | ORAL | 0 refills | Status: DC

## 2023-12-30 NOTE — Patient Instructions (Signed)
 Nice to see you today The EKG looked good in office I have sent in blood pressure medication for you to start taking every day Follow up with me in 1 month, sooner if you need me

## 2023-12-30 NOTE — Assessment & Plan Note (Signed)
 EKG in office within normal limits.  Will treat blood pressure

## 2023-12-30 NOTE — Assessment & Plan Note (Signed)
 Patient brought many readings from at home blood pressure recording sent those to be scanned in chart.  Will treat patient with amlodipine  5 mg daily follow-up 1 month for blood pressure recheck

## 2023-12-30 NOTE — Progress Notes (Signed)
 Acute Office Visit  Subjective:     Patient ID: Joel Ramirez, male    DOB: Feb 19, 1961, 63 y.o.   MRN: [id number]  Chief Complaint  Patient presents with   Hypertension    Pt complains of high BP numbers in the morning. Reading yesterday 140/84. Bp ranges from 155/99, 145/91, 136/86, 160/95 on 1/12.      Patient is in today for elevatd blood pressure readings with a history of alcohol abuse  States that he has checkn his blood pressure for the past year and half. States that he has been checking it daily.  As of late patient's blood pressures have been elevated.  He did have a home health assessment with both blood pressure readings were elevated.  Patient has not had to be on blood pressure medication in the past.  He is still not drinking any alcohol per his report.  Patient did endorse a "pounding heartbeat with some chest tightness" when his blood pressure got in the 160s systolically   Review of Systems  Constitutional:  Negative for chills and fever.  Eyes:  Negative for blurred vision and double vision.  Respiratory:  Negative for shortness of breath.   Cardiovascular:  Negative for chest pain (chest tightness).  Neurological:  Negative for dizziness and headaches.        Objective:    BP (!) 154/100   Pulse 73   Temp 98.2 F (36.8 C) (Oral)   Ht 6\' 1"  (1.854 m)   Wt 174 lb 12.8 oz (79.3 kg)   SpO2 98%   BMI 23.06 kg/m  BP Readings from Last 3 Encounters:  12/30/23 (!) 154/100  11/11/23 (!) 136/92  08/28/23 120/78   Wt Readings from Last 3 Encounters:  12/30/23 174 lb 12.8 oz (79.3 kg)  11/11/23 160 lb (72.6 kg)  08/28/23 155 lb 6.4 oz (70.5 kg)   SpO2 Readings from Last 3 Encounters:  12/30/23 98%  11/11/23 100%  08/28/23 99%      Physical Exam Vitals and nursing note reviewed.  Constitutional:      Appearance: Normal appearance.  Cardiovascular:     Rate and Rhythm: Normal rate and regular rhythm.     Heart sounds: Normal heart sounds.   Pulmonary:     Effort: Pulmonary effort is normal.     Breath sounds: Normal breath sounds.  Musculoskeletal:     Right lower leg: No edema.     Left lower leg: No edema.  Neurological:     Mental Status: He is alert.     No results found for any visits on 12/30/23.      Assessment & Plan:   Problem List Items Addressed This Visit       Cardiovascular and Mediastinum   Primary hypertension - Primary   Patient brought many readings from at home blood pressure recording sent those to be scanned in chart.  Will treat patient with amlodipine  5 mg daily follow-up 1 month for blood pressure recheck      Relevant Medications   amLODipine  (NORVASC ) 5 MG tablet   Other Relevant Orders   EKG 12-Lead (Completed)     Other   Chest discomfort   EKG in office within normal limits.  Will treat blood pressure      Relevant Orders   EKG 12-Lead (Completed)    Meds ordered this encounter  Medications   amLODipine  (NORVASC ) 5 MG tablet    Sig: Take 1 tablet (5 mg total) by mouth daily.  Dispense:  90 tablet    Refill:  0    Supervising Provider:   Deri Fleet A [[id number]]    Return in about 4 weeks (around 01/27/2024) for BP recheck.  Margarie Shay, NP

## 2024-01-11 ENCOUNTER — Encounter: Payer: Self-pay | Admitting: Gastroenterology

## 2024-01-11 ENCOUNTER — Ambulatory Visit (INDEPENDENT_AMBULATORY_CARE_PROVIDER_SITE_OTHER): Payer: Medicaid Other | Admitting: Gastroenterology

## 2024-01-11 VITALS — BP 131/84 | HR 71 | Temp 98.4°F | Ht 73.0 in | Wt 181.6 lb

## 2024-01-11 DIAGNOSIS — K648 Other hemorrhoids: Secondary | ICD-10-CM

## 2024-01-11 DIAGNOSIS — K640 First degree hemorrhoids: Secondary | ICD-10-CM

## 2024-01-11 NOTE — Progress Notes (Signed)
Patient follow-ups today for banding of hemorrhoids    Summary of history :  Recent colonoscopy in 10/2023 showed medium sized non bleeding internal hemorroids . Here today to discuss about banding   He says he tried conservative management for internal hemorrhoids but causes a lot of discomfort hence he would like to get it banded.  Digital rectal exam performed in the presence of a chaperone.  CMA Maritza External anal findings: Normal Internal findings: , No masses, no blood on glove noticed.    PROCEDURE NOTE: The patient presents with symptomatic grade 1 hemorrhoids, unresponsive to maximal medical therapy, requesting rubber band ligation of his/her hemorrhoidal disease.  All risks, benefits and alternative forms of therapy were described and informed consent was obtained.  In the Left Lateral Decubitus position (if anoscopy is performed) anoscopic examination revealed grade 1 hemorrhoids in the all position(s).   The decision was made to band the LL internal hemorrhoid, and the [facility] O'Regan System was used to perform band ligation without complication.  Digital anorectal examination was then performed to assure proper positioning of the band, and to adjust the banded tissue as required.  The patient was discharged home without pain or other issues.  Dietary and behavioral recommendations were given and (if necessary - prescriptions were given), along with follow-up instructions.  The patient will return 4 weeks for follow-up and possible additional banding as required.  No complications were encountered and the patient tolerated the procedure well.   Plan:  Avoid constipation.    Follow-up: 4 weeks  Dr Wyline Mood MD,MRCP [facility]) Gastroenterology/Hepatology Pager: [phone]

## 2024-01-27 ENCOUNTER — Ambulatory Visit (INDEPENDENT_AMBULATORY_CARE_PROVIDER_SITE_OTHER): Payer: Medicaid Other | Admitting: Nurse Practitioner

## 2024-01-27 VITALS — BP 132/90 | HR 78 | Temp 98.1°F | Ht 73.0 in | Wt 182.6 lb

## 2024-01-27 DIAGNOSIS — I10 Essential (primary) hypertension: Secondary | ICD-10-CM | POA: Diagnosis not present

## 2024-01-27 NOTE — Progress Notes (Signed)
   Established Patient Office Visit  Subjective   Patient ID: Joel Ramirez, male    DOB: January 26, 1961  Age: 63 y.o. MRN: [id number]  Chief Complaint  Patient presents with   Follow-up    BP recheck. Pt states BP was taken at 8AM. (168/93)    HPI  HTN: patient was seen by me on 12/30/2023 and stared on amlodipine 5mg  daily and he is here for a recheck. He is checking his blood pressure at home with an automatic upper arm cuff. He is checking it   He did have the banding of the hemrrhoids and blood pressure within normal limits there.  Patient blood pressure readings at home have been in the 1 teens to 120s over 70s most of the time.  Patient states he does not feel well today and feels a little flushed which blood pressure is higher than what it has been being.  Patient did bring his cuff we reviewed proper use of the machine and upon recheck his blood pressure reading was within 10 points systolic of ours within 5 points diastolic of ours.   Review of Systems  Constitutional:  Negative for chills and fever.  Respiratory:  Negative for shortness of breath.   Cardiovascular:  Positive for chest pain (chest discomfort).  Neurological:  Negative for dizziness and headaches.  Psychiatric/Behavioral:  Negative for hallucinations and suicidal ideas.       Objective:     BP (!) 132/90   Pulse 78   Temp 98.1 F (36.7 C) (Oral)   Ht 6\' 1"  (1.854 m)   Wt 182 lb 9.6 oz (82.8 kg)   SpO2 97%   BMI 24.09 kg/m  BP Readings from Last 3 Encounters:  01/27/24 (!) 132/90  01/11/24 131/84  12/30/23 (!) 154/100   Wt Readings from Last 3 Encounters:  01/27/24 182 lb 9.6 oz (82.8 kg)  01/11/24 181 lb 9.6 oz (82.4 kg)  12/30/23 174 lb 12.8 oz (79.3 kg)   SpO2 Readings from Last 3 Encounters:  01/27/24 97%  12/30/23 98%  11/11/23 100%      Physical Exam Vitals and nursing note reviewed.  Constitutional:      Appearance: Normal appearance.  Cardiovascular:     Rate and Rhythm:  Normal rate and regular rhythm.     Heart sounds: Normal heart sounds.  Pulmonary:     Effort: Pulmonary effort is normal.     Breath sounds: Normal breath sounds.  Musculoskeletal:     Right lower leg: No edema.     Left lower leg: No edema.  Neurological:     Mental Status: He is alert.      No results found for any visits on 01/27/24.    The 10-year ASCVD risk score (Arnett DK, et al., 2019) is: 8.1%    Assessment & Plan:   Problem List Items Addressed This Visit       Cardiovascular and Mediastinum   Primary hypertension - Primary   Patient currently maintained on amlodipine 5 mg daily.  Blood pressure slightly above goal today.  Given home readings have been within normal limits we will continue amlodipine 5 mg daily continue taking blood pressure couple times a week.  We will plan to check in with patient in 2 weeks to see what his home readings have been       Return in about 3 months (around 04/25/2024) for BP recheck.    Audria Nine, NP

## 2024-01-27 NOTE — Patient Instructions (Signed)
Nice to see you today I want to see you in 3 months for a blood pressure recheck  Continue checking blood pressure 2-3 times a week at home We will reach out for those numbers

## 2024-01-27 NOTE — Assessment & Plan Note (Signed)
Patient currently maintained on amlodipine 5 mg daily.  Blood pressure slightly above goal today.  Given home readings have been within normal limits we will continue amlodipine 5 mg daily continue taking blood pressure couple times a week.  We will plan to check in with patient in 2 weeks to see what his home readings have been

## 2024-02-10 ENCOUNTER — Telehealth: Payer: Self-pay | Admitting: Nurse Practitioner

## 2024-02-10 NOTE — Telephone Encounter (Signed)
 Can we call and see what the patients at home blood pressure readings have been

## 2024-02-10 NOTE — Telephone Encounter (Signed)
-----   Message from [facility] sent at 01/27/2024 10:05 AM EST ----- Regarding: BP Call and get at home blood pressure readings

## 2024-02-10 NOTE — Telephone Encounter (Signed)
 Contacted pt  Pt says that he has been doing pretty good with the BP readings.  States that his BP has not gone over 120 systolic in a while.  Pt took BP this morning, reading was 110/70. Pt states that it usually stays within that range

## 2024-02-11 ENCOUNTER — Ambulatory Visit: Payer: Medicaid Other | Admitting: Gastroenterology

## 2024-02-11 VITALS — BP 129/83 | HR 73 | Temp 98.3°F | Wt 191.0 lb

## 2024-02-11 DIAGNOSIS — K640 First degree hemorrhoids: Secondary | ICD-10-CM | POA: Diagnosis not present

## 2024-02-11 DIAGNOSIS — K648 Other hemorrhoids: Secondary | ICD-10-CM

## 2024-02-11 NOTE — Progress Notes (Signed)
 Patient follow-ups today for banding of hemorrhoids    Summary of history : Summary of history :   Recent colonoscopy in 10/2023 showed medium sized non bleeding internal hemorroids . Here today to discuss about banding    He says he tried conservative management for internal hemorrhoids but causes a lot of discomfort hence he would like to get it banded.  First round:01/11/2024: LL column banded    Interval history   01/11/2024-02/11/2024  No issues since last round of banding perianal discomfort has improved significantly  Digital rectal exam performed in the presence of a chaperone.  CMA Maritza External anal findings: Normal Internal findings: , No masses, no blood on glove noticed.    PROCEDURE NOTE: The patient presents with symptomatic grade 1 hemorrhoids, unresponsive to maximal medical therapy, requesting rubber band ligation of his/her hemorrhoidal disease.  All risks, benefits and alternative forms of therapy were described and informed consent was obtained.  In the Left Lateral Decubitus position (if anoscopy is performed) anoscopic examination revealed grade 1 hemorrhoids in the RA and RP position(s).   The decision was made to band the RA internal hemorrhoid, and the [facility] O'Regan System was used to perform band ligation without complication.  Digital anorectal examination was then performed to assure proper positioning of the band, and to adjust the banded tissue as required.  The patient was discharged home without pain or other issues.  Dietary and behavioral recommendations were given and (if necessary - prescriptions were given), along with follow-up instructions.  The patient will return 4 weeks for follow-up and possible additional banding as required.  No complications were encountered and the patient tolerated the procedure well.   Plan:  Avoid constipation.  Commence on stool softeners if not already on  Follow-up:4weeks   Dr Wyline Mood MD,MRCP  [facility]) Gastroenterology/Hepatology Pager: [phone]

## 2024-03-15 ENCOUNTER — Ambulatory Visit (INDEPENDENT_AMBULATORY_CARE_PROVIDER_SITE_OTHER): Payer: Medicaid Other | Admitting: Gastroenterology

## 2024-03-15 ENCOUNTER — Encounter: Payer: Self-pay | Admitting: Gastroenterology

## 2024-03-15 ENCOUNTER — Other Ambulatory Visit: Payer: Self-pay | Admitting: Nurse Practitioner

## 2024-03-15 VITALS — BP 150/91 | HR 76 | Temp 98.0°F | Wt 185.0 lb

## 2024-03-15 DIAGNOSIS — K640 First degree hemorrhoids: Secondary | ICD-10-CM | POA: Diagnosis not present

## 2024-03-15 DIAGNOSIS — I10 Essential (primary) hypertension: Secondary | ICD-10-CM

## 2024-03-15 DIAGNOSIS — K648 Other hemorrhoids: Secondary | ICD-10-CM

## 2024-03-15 NOTE — Progress Notes (Signed)
 Patient follow-ups today for banding of hemorrhoids    Summary of history : Recent colonoscopy in 10/2023 showed medium sized non bleeding internal hemorroids . Here today to discuss about banding    He says he tried conservative management for internal hemorrhoids but causes a lot of discomfort hence he would like to get it banded.   First round:01/11/2024: LL column banded  Second round:02/11/2024: Right anterior column banded   Interval history 02/11/2024-   03/15/2024   Doing well no complaints since the last banding   Digital rectal exam performed in the presence of a chaperone.  CMA Maritza External anal findings: Normal Internal findings: Normal, No masses, no blood on glove noticed.    PROCEDURE NOTE: The patient presents with symptomatic grade 1 hemorrhoids, unresponsive to maximal medical therapy, requesting rubber band ligation of his/her hemorrhoidal disease.  All risks, benefits and alternative forms of therapy were described and informed consent was obtained.  In the Left Lateral Decubitus position (if anoscopy is performed) anoscopic examination revealed grade 1 hemorrhoids in the RP position(s).   The decision was made to band the RP internal hemorrhoid, and the [facility] O'Regan System was used to perform band ligation without complication.  Digital anorectal examination was then performed to assure proper positioning of the band, and to adjust the banded tissue as required.  The patient was discharged home without pain or other issues.  Dietary and behavioral recommendations were given and (if necessary - prescriptions were given), along with follow-up instructions.  The patient will return  as needed for follow-up and possible additional banding as required.  No complications were encountered and the patient tolerated the procedure well.   Plan:  Avoid constipation.    Follow-up: As needed  Dr Wyline Mood MD,MRCP [facility]) Gastroenterology/Hepatology Pager:  [phone]

## 2024-04-26 ENCOUNTER — Ambulatory Visit (INDEPENDENT_AMBULATORY_CARE_PROVIDER_SITE_OTHER): Payer: Medicaid Other | Admitting: Nurse Practitioner

## 2024-04-26 VITALS — BP 136/88 | HR 80 | Temp 98.2°F | Ht 73.0 in | Wt 183.8 lb

## 2024-04-26 DIAGNOSIS — I10 Essential (primary) hypertension: Secondary | ICD-10-CM

## 2024-04-26 NOTE — Patient Instructions (Signed)
 Nice to see you today Blood pressure looks good in office I want to see you in approx 4 months for your physical and labs, sooner if you need me

## 2024-04-26 NOTE — Assessment & Plan Note (Signed)
 Patient currently maintained on amlodipine  5 mg daily.  He tolerated medication well.  Blood pressure well-controlled.  Patient does not check blood pressure at home brought in via readings all within normal limits.  Except as brought to be scanned into chart.  Continue medication as prescribed

## 2024-04-26 NOTE — Progress Notes (Signed)
   Established Patient Office Visit  Subjective   Patient ID: Akiva Mascolo, male    DOB: 1961-07-17  Age: 63 y.o. MRN: [id number]  Chief Complaint  Patient presents with   Blood Pressure Check    Last BP reading this morning @8 :59am 132/82. Pt complains of doing well with his blood pressure.     HPI  HTN: patient was seen by me on 01/27/2024 for high blood pressure. He is currently maintained on 5mg  daily. He was slightly above goal. He is here for a recheck.  He has been checking it intermittently apporx 5 times since the last office visit.   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  Respiratory:  Negative for shortness of breath.   Cardiovascular:  Negative for chest pain and leg swelling.  Gastrointestinal:  Negative for constipation.  Neurological:  Negative for dizziness and headaches.      Objective:     BP 136/88   Pulse 80   Temp 98.2 F (36.8 C) (Oral)   Ht 6\' 1"  (1.854 m)   Wt 183 lb 12.8 oz (83.4 kg)   SpO2 98%   BMI 24.25 kg/m  BP Readings from Last 3 Encounters:  04/26/24 136/88  03/15/24 (!) 150/91  02/11/24 129/83   Wt Readings from Last 3 Encounters:  04/26/24 183 lb 12.8 oz (83.4 kg)  03/15/24 185 lb (83.9 kg)  02/11/24 191 lb (86.6 kg)   SpO2 Readings from Last 3 Encounters:  04/26/24 98%  01/27/24 97%  12/30/23 98%      Physical Exam Vitals and nursing note reviewed.  Constitutional:      Appearance: Normal appearance.  Cardiovascular:     Rate and Rhythm: Normal rate and regular rhythm.     Heart sounds: Normal heart sounds.  Pulmonary:     Effort: Pulmonary effort is normal.     Breath sounds: Normal breath sounds.  Musculoskeletal:     Right lower leg: No edema.     Left lower leg: No edema.  Neurological:     Mental Status: He is alert.      No results found for any visits on 04/26/24.    The  10-year ASCVD risk score (Arnett DK, et al., 2019) is: 8.6%    Assessment & Plan:   Problem List Items Addressed This Visit       Cardiovascular and Mediastinum   Primary hypertension - Primary    Return in about 4 months (around 08/27/2024) for CPE and Labs.    Margarie Shay, NP

## 2024-04-26 NOTE — Progress Notes (Signed)
   Established Patient Office Visit  Subjective   Patient ID: Joel Ramirez, male    DOB: 1961/06/16  Age: 63 y.o. MRN: [id number]  Chief Complaint  Patient presents with   Blood Pressure Check    Last BP reading this morning @8 :59am 132/82. Pt complains of doing well with his blood pressure.     HPI  HTN: patient was seen by me on 01/27/2024 for high blood pressure. He is currently maintained on 5mg  daily. He was slightly above goal. He is here for a recheck.  He has been checking it intermittently apporx 5 times since the last office visit.   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  Respiratory:  Negative for shortness of breath.   Cardiovascular:  Negative for chest pain and leg swelling.  Gastrointestinal:  Negative for constipation.  Neurological:  Negative for dizziness and headaches.      Objective:     BP 136/88   Pulse 80   Temp 98.2 F (36.8 C) (Oral)   Ht 6\' 1"  (1.854 m)   Wt 183 lb 12.8 oz (83.4 kg)   SpO2 98%   BMI 24.25 kg/m  BP Readings from Last 3 Encounters:  04/26/24 136/88  03/15/24 (!) 150/91  02/11/24 129/83   Wt Readings from Last 3 Encounters:  04/26/24 183 lb 12.8 oz (83.4 kg)  03/15/24 185 lb (83.9 kg)  02/11/24 191 lb (86.6 kg)   SpO2 Readings from Last 3 Encounters:  04/26/24 98%  01/27/24 97%  12/30/23 98%      Physical Exam Vitals and nursing note reviewed.  Constitutional:      Appearance: Normal appearance.  Cardiovascular:     Rate and Rhythm: Normal rate and regular rhythm.     Heart sounds: Normal heart sounds.  Pulmonary:     Effort: Pulmonary effort is normal.     Breath sounds: Normal breath sounds.  Musculoskeletal:     Right lower leg: No edema.     Left lower leg: No edema.  Neurological:     Mental Status: He is alert.      No results found for any visits on 04/26/24.    The  10-year ASCVD risk score (Arnett DK, et al., 2019) is: 8.6%    Assessment & Plan:   Problem List Items Addressed This Visit       Cardiovascular and Mediastinum   Primary hypertension - Primary   Patient currently maintained on amlodipine  5 mg daily.  He tolerated medication well.  Blood pressure well-controlled.  Patient does not check blood pressure at home brought in via readings all within normal limits.  Except as brought to be scanned into chart.  Continue medication as prescribed       Return in about 4 months (around 08/27/2024) for CPE and Labs.    Margarie Shay, NP

## 2024-06-15 ENCOUNTER — Other Ambulatory Visit: Payer: Self-pay | Admitting: Nurse Practitioner

## 2024-06-15 DIAGNOSIS — I10 Essential (primary) hypertension: Secondary | ICD-10-CM

## 2024-08-04 ENCOUNTER — Ambulatory Visit: Payer: 59 | Admitting: Dermatology

## 2024-08-04 ENCOUNTER — Encounter: Payer: Self-pay | Admitting: Dermatology

## 2024-08-04 DIAGNOSIS — D1801 Hemangioma of skin and subcutaneous tissue: Secondary | ICD-10-CM

## 2024-08-04 DIAGNOSIS — L578 Other skin changes due to chronic exposure to nonionizing radiation: Secondary | ICD-10-CM

## 2024-08-04 DIAGNOSIS — W908XXA Exposure to other nonionizing radiation, initial encounter: Secondary | ICD-10-CM | POA: Diagnosis not present

## 2024-08-04 DIAGNOSIS — Z1283 Encounter for screening for malignant neoplasm of skin: Secondary | ICD-10-CM

## 2024-08-04 DIAGNOSIS — R238 Other skin changes: Secondary | ICD-10-CM

## 2024-08-04 DIAGNOSIS — L570 Actinic keratosis: Secondary | ICD-10-CM | POA: Diagnosis not present

## 2024-08-04 DIAGNOSIS — L814 Other melanin hyperpigmentation: Secondary | ICD-10-CM | POA: Diagnosis not present

## 2024-08-04 DIAGNOSIS — I781 Nevus, non-neoplastic: Secondary | ICD-10-CM

## 2024-08-04 DIAGNOSIS — Z86018 Personal history of other benign neoplasm: Secondary | ICD-10-CM

## 2024-08-04 DIAGNOSIS — D229 Melanocytic nevi, unspecified: Secondary | ICD-10-CM

## 2024-08-04 NOTE — Patient Instructions (Addendum)
 Actinic keratoses are precancerous spots that appear secondary to cumulative UV radiation exposure/sun exposure over time. They are chronic with expected duration over 1 year. A portion of actinic keratoses will progress to squamous cell carcinoma of the skin. It is not possible to reliably predict which spots will progress to skin cancer and so treatment is recommended to prevent development of skin cancer.  Recommend daily broad spectrum sunscreen SPF 30+ to sun-exposed areas, reapply every 2 hours as needed.  Recommend staying in the shade or wearing long sleeves, sun glasses (UVA+UVB protection) and wide brim hats (4-inch brim around the entire circumference of the hat). Call for new or changing lesions.   Cryotherapy Aftercare  Wash gently with soap and water everyday.   Apply Vaseline and Band-Aid daily until healed.     Melanoma ABCDEs  Melanoma is the most dangerous type of skin cancer, and is the leading cause of death from skin disease.  You are more likely to develop melanoma if you: Have light-colored skin, light-colored eyes, or red or blond hair Spend a lot of time in the sun Tan regularly, either outdoors or in a tanning bed Have had blistering sunburns, especially during childhood Have a close family member who has had a melanoma Have atypical moles or large birthmarks  Early detection of melanoma is key since treatment is typically straightforward and cure rates are extremely high if we catch it early.   The first sign of melanoma is often a change in a mole or a new dark spot.  The ABCDE system is a way of remembering the signs of melanoma.  A for asymmetry:  The two halves do not match. B for border:  The edges of the growth are irregular. C for color:  A mixture of colors are present instead of an even brown color. D for diameter:  Melanomas are usually (but not always) greater than 6mm - the size of a pencil eraser. E for evolution:  The spot keeps changing in  size, shape, and color.  Please check your skin once per month between visits. You can use a small mirror in front and a large mirror behind you to keep an eye on the back side or your body.   If you see any new or changing lesions before your next follow-up, please call to schedule a visit.  Please continue daily skin protection including broad spectrum sunscreen SPF 30+ to sun-exposed areas, reapplying every 2 hours as needed when you're outdoors.   Staying in the shade or wearing long sleeves, sun glasses (UVA+UVB protection) and wide brim hats (4-inch brim around the entire circumference of the hat) are also recommended for sun protection.    Due to recent changes in healthcare laws, you may see results of your pathology and/or laboratory studies on MyChart before the doctors have had a chance to review them. We understand that in some cases there may be results that are confusing or concerning to you. Please understand that not all results are received at the same time and often the doctors may need to interpret multiple results in order to provide you with the best plan of care or course of treatment. Therefore, we ask that you please give us  2 business days to thoroughly review all your results before contacting the office for clarification. Should we see a critical lab result, you will be contacted sooner.   If You Need Anything After Your Visit  If you have any questions or concerns for your  doctor, please call our main line at [phone] and press option 4 to reach your doctor's medical assistant. If no one answers, please leave a voicemail as directed and we will return your call as soon as possible. Messages left after 4 pm will be answered the following business day.   You may also send us  a message via MyChart. We typically respond to MyChart messages within 1-2 business days.  For prescription refills, please ask your pharmacy to contact our office. Our fax number is  [phone].  If you have an urgent issue when the clinic is closed that cannot wait until the next business day, you can page your doctor at the number below.    Please note that while we do our best to be available for urgent issues outside of office hours, we are not available 24/7.   If you have an urgent issue and are unable to reach us , you may choose to seek medical care at your doctor's office, retail clinic, urgent care center, or emergency room.  If you have a medical emergency, please immediately call 911 or go to the emergency department.  Pager Numbers  - Dr. Hester: [phone]  - Dr. Jackquline: [phone]  - Dr. Claudene: [phone]   - Dr. Raymund: [phone]  In the event of inclement weather, please call our main line at [phone] for an update on the status of any delays or closures.  Dermatology Medication Tips: Please keep the boxes that topical medications come in in order to help keep track of the instructions about where and how to use these. Pharmacies typically print the medication instructions only on the boxes and not directly on the medication tubes.   If your medication is too expensive, please contact our office at [phone] option 4 or send us  a message through MyChart.   We are unable to tell what your co-pay for medications will be in advance as this is different depending on your insurance coverage. However, we may be able to find a substitute medication at lower cost or fill out paperwork to get insurance to cover a needed medication.   If a prior authorization is required to get your medication covered by your insurance company, please allow us  1-2 business days to complete this process.  Drug prices often vary depending on where the prescription is filled and some pharmacies may offer cheaper prices.  The website www.goodrx.com contains coupons for medications through different pharmacies. The prices here do not account for what the cost  may be with help from insurance (it may be cheaper with your insurance), but the website can give you the price if you did not use any insurance.  - You can print the associated coupon and take it with your prescription to the pharmacy.  - You may also stop by our office during regular business hours and pick up a GoodRx coupon card.  - If you need your prescription sent electronically to a different pharmacy, notify our office through [facility] or by phone at [phone] option 4.     Si Usted Necesita Algo Despus de Su Visita  Tambin puede enviarnos un mensaje a travs de Clinical cytogeneticist. Por lo general respondemos a los mensajes de MyChart en el transcurso de 1 a 2 das hbiles.  Para renovar recetas, por favor pida a su farmacia que se ponga en contacto con nuestra oficina. Randi lakes de fax es [place] [phone].  Si tiene un asunto urgente cuando la clnica est cerrada y que  no puede esperar hasta el siguiente da hbil, puede llamar/localizar a su doctor(a) al nmero que aparece a continuacin.   Por favor, tenga en cuenta que aunque hacemos todo lo posible para estar disponibles para asuntos urgentes fuera del horario de [place], no estamos disponibles las 24 horas del da, los 7 [address]  [address] de la [place].   Si tiene un problema urgente y no puede comunicarse con nosotros, puede optar por buscar atencin mdica  en el consultorio de su doctor(a), en una clnica privada, en un centro de atencin urgente o en una sala de emergencias.  Si tiene Engineer, drilling, por favor llame inmediatamente al 911 o vaya a la sala de emergencias.  Nmeros de bper  - Dr. Hester: [phone]  - Dra. Jackquline: [phone]  - Dr. Claudene: [phone]  - Dra. Kitts: [phone]  En caso de inclemencias del [place], por favor llame a nuestra lnea principal al [phone] para una actualizacin sobre el estado de cualquier retraso o cierre.  Consejos para la medicacin en dermatologa: Por  favor, guarde las cajas en las que vienen los medicamentos de uso tpico para ayudarle a seguir las instrucciones sobre dnde y cmo usarlos. Las farmacias generalmente imprimen las instrucciones del medicamento slo en las cajas y no directamente en los tubos del [place].   Si su medicamento es muy caro, por favor, pngase en contacto con landry rieger llamando al [phone] y presione la opcin 4 o envenos un mensaje a travs de Clinical cytogeneticist.   No podemos decirle cul ser su copago por los medicamentos por adelantado ya que esto es diferente dependiendo de la cobertura de su seguro. Sin embargo, es posible que podamos encontrar un medicamento sustituto a Audiological scientist un formulario para que el seguro cubra el medicamento que se considera necesario.   Si se requiere una autorizacin previa para que su compaa de seguros malta su medicamento, por favor permtanos de 1 a 2 das hbiles para completar este proceso.  Los precios de los medicamentos varan con frecuencia dependiendo del Environmental consultant de dnde se surte la receta y alguna farmacias pueden ofrecer precios ms baratos.  El sitio web www.goodrx.com tiene cupones para medicamentos de Health and safety inspector. Los precios aqu no tienen en cuenta lo que podra costar con la ayuda del seguro (puede ser ms barato con su seguro), pero el sitio web puede darle el precio si no utiliz Tourist information centre manager.  - Puede imprimir el cupn correspondiente y llevarlo con su receta a la farmacia.  - Tambin puede pasar por nuestra oficina durante el horario de atencin regular y Education officer, museum una tarjeta de cupones de GoodRx.  - Si necesita que su receta se enve electrnicamente a una farmacia diferente, informe a nuestra oficina a travs de MyChart de [place] o por telfono llamando al [phone] y presione la opcin 4.

## 2024-08-04 NOTE — Progress Notes (Signed)
 Follow-Up Visit   Subjective  Joel Ramirez is a 63 y.o. male who presents for the following: Skin Cancer Screening and Full Body Skin Exam Hx of dysplastic nevi, hx of aks  The patient presents for Total-Body Skin Exam (TBSE) for skin cancer screening and mole check. The patient has spots, moles and lesions to be evaluated, some may be new or changing and the patient may have concern these could be cancer.  The following portions of the chart were reviewed this encounter and updated as appropriate: medications, allergies, medical history  Review of Systems:  No other skin or systemic complaints except as noted in HPI or Assessment and Plan.  Objective  Well appearing patient in no apparent distress; mood and affect are within normal limits.  A full examination was performed including scalp, head, eyes, ears, nose, lips, neck, chest, axillae, abdomen, back, buttocks, bilateral upper extremities, bilateral lower extremities, hands, feet, fingers, toes, fingernails, and toenails. All findings within normal limits unless otherwise noted below.   Relevant physical exam findings are noted in the Assessment and Plan.  scalp face and ears x 10 (10) Erythematous thin papules/macules with gritty scale.   Assessment & Plan   SKIN CANCER SCREENING PERFORMED TODAY.  ACTINIC DAMAGE - Chronic condition, secondary to cumulative UV/sun exposure - diffuse scaly erythematous macules with underlying dyspigmentation - Recommend daily broad spectrum sunscreen SPF 30+ to sun-exposed areas, reapply every 2 hours as needed.  - Staying in the shade or wearing long sleeves, sun glasses (UVA+UVB protection) and wide brim hats (4-inch brim around the entire circumference of the hat) are also recommended for sun protection.  - Call for new or changing lesions.  LENTIGINES, SEBORRHEIC KERATOSES, HEMANGIOMAS - Benign normal skin lesions - Benign-appearing - Call for any changes  MELANOCYTIC NEVI -  Tan-brown and/or pink-flesh-colored symmetric macules and papules - Benign appearing on exam today - Observation - Call clinic for new or changing moles - Recommend daily use of broad spectrum spf 30+ sunscreen to sun-exposed areas.   Varicose Veins/Spider Veins - Dilated blue, purple or red veins at the lower extremities - Reassured - Smaller vessels can be treated by sclerotherapy (a procedure to inject a medicine into the veins to make them disappear) if desired, but the treatment is not covered by insurance. Larger vessels may be covered if symptomatic and we would refer to vascular surgeon if treatment desired.   [place] Exam: red or purple papule at left neck Treatment Plan: Benign-appearing. Observe  Counseling for BBL / IPL / Laser and Coordination of Care Discussed the treatment option of Broad Band Light (BBL) /Intense Pulsed Light (IPL)/ Laser for skin discoloration, including brown spots and redness.  Typically we recommend at least 1-3 treatment sessions about 5-8 weeks apart for best results.  Cannot have tanned skin when BBL performed, and regular use of sunscreen/photoprotection is advised after the procedure to help maintain results. The patient's condition may also require maintenance treatments in the future.  The fee for BBL / laser treatments is $350 per treatment session for the whole face.  A fee can be quoted for other parts of the body.  Insurance typically does not pay for BBL/laser treatments and therefore the fee is an out-of-pocket cost. Recommend prophylactic valtrex treatment. Once scheduled for procedure, will send Rx in prior to patient's appointment.   HISTORY OF DYSPLASTIC NEVUS.  Right mid medial thigh. Severe atypia,  Excised 07/10/2020 with margins free. No evidence of recurrence today Recommend regular  full body skin exams Recommend daily broad spectrum sunscreen SPF 30+ to sun-exposed areas, reapply every 2 hours as needed.  Call if any new or  changing lesions are noted between office visits  ACTINIC KERATOSIS (10) scalp face and ears x 10 (10) Actinic keratoses are precancerous spots that appear secondary to cumulative UV radiation exposure/sun exposure over time. They are chronic with expected duration over 1 year. A portion of actinic keratoses will progress to squamous cell carcinoma of the skin. It is not possible to reliably predict which spots will progress to skin cancer and so treatment is recommended to prevent development of skin cancer.  Recommend daily broad spectrum sunscreen SPF 30+ to sun-exposed areas, reapply every 2 hours as needed.  Recommend staying in the shade or wearing long sleeves, sun glasses (UVA+UVB protection) and wide brim hats (4-inch brim around the entire circumference of the hat). Call for new or changing lesions. Destruction of lesion - scalp face and ears x 10 (10) Complexity: simple   Destruction method: cryotherapy   Informed consent: discussed and consent obtained   Timeout:  patient name, date of birth, surgical site, and procedure verified Lesion destroyed using liquid nitrogen: Yes   Region frozen until ice ball extended beyond lesion: Yes   Outcome: patient tolerated procedure well with no complications   Post-procedure details: wound care instructions given    Return in about 1 year (around 08/04/2025) for TBSE.  IEleanor Blush, CMA, am acting as scribe for Alm Rhyme, MD.   Documentation: I have reviewed the above documentation for accuracy and completeness, and I agree with the above.  Alm Rhyme, MD

## 2024-09-02 ENCOUNTER — Encounter: Payer: Self-pay | Admitting: Nurse Practitioner

## 2024-09-02 ENCOUNTER — Ambulatory Visit (INDEPENDENT_AMBULATORY_CARE_PROVIDER_SITE_OTHER): Admitting: Nurse Practitioner

## 2024-09-02 VITALS — BP 112/62 | HR 71 | Temp 98.2°F | Ht 72.5 in | Wt 184.4 lb

## 2024-09-02 DIAGNOSIS — Z1159 Encounter for screening for other viral diseases: Secondary | ICD-10-CM

## 2024-09-02 DIAGNOSIS — Z13220 Encounter for screening for lipoid disorders: Secondary | ICD-10-CM | POA: Diagnosis not present

## 2024-09-02 DIAGNOSIS — I10 Essential (primary) hypertension: Secondary | ICD-10-CM

## 2024-09-02 DIAGNOSIS — Z131 Encounter for screening for diabetes mellitus: Secondary | ICD-10-CM

## 2024-09-02 DIAGNOSIS — Z114 Encounter for screening for human immunodeficiency virus [HIV]: Secondary | ICD-10-CM

## 2024-09-02 DIAGNOSIS — Z87891 Personal history of nicotine dependence: Secondary | ICD-10-CM | POA: Insufficient documentation

## 2024-09-02 DIAGNOSIS — Z125 Encounter for screening for malignant neoplasm of prostate: Secondary | ICD-10-CM

## 2024-09-02 DIAGNOSIS — Z0000 Encounter for general adult medical examination without abnormal findings: Secondary | ICD-10-CM

## 2024-09-02 DIAGNOSIS — Z23 Encounter for immunization: Secondary | ICD-10-CM | POA: Diagnosis not present

## 2024-09-02 NOTE — Progress Notes (Signed)
 Established Patient Office Visit  Subjective   Patient ID: Joel Ramirez, male    DOB: [birth year]  Age: 63 y.o. MRN: [id number]  Chief Complaint  Patient presents with   Annual Exam    Would like flu shot.     HPI  HTN: Patient currently maintained on amlodipine  5 mg daily.  Skin cancer: Patient currently followed by dermatology sees them at least yearly.  Most recent visit was 08/04/2024  for complete physical and follow up of chronic conditions.  Immunizations: -Tetanus: Completed in 2023 -Influenza: up date today  -Shingles: ger at local pharmacy  -Pneumonia: get at local pharmacy   Diet: Fair diet. He is eating 3 meals a day. He will snack. He will drink coffee and water. Some orange juice  Exercise: No regular exercise. Some walking and working  Eye exam: PRN  Dental exam: Completes semi-annually    Colonoscopy: Completed in 11/11/2023, repeat 10 years.  Patient due 2034 Lung Cancer Screening: Does not qualify  PSA: Due  Sleep: going to bed around 10 and get up around 6. Feels rested. Does not snore        Review of Systems  Constitutional:  Negative for chills and fever.  Respiratory:  Negative for shortness of breath.   Cardiovascular:  Negative for chest pain and leg swelling.  Gastrointestinal:  Negative for abdominal pain, blood in stool, constipation, diarrhea, nausea and vomiting.       BM daily   Genitourinary:  Negative for dysuria and hematuria.  Neurological:  Negative for dizziness, tingling and headaches.  Psychiatric/Behavioral:  Negative for hallucinations and suicidal ideas.       Objective:     BP 112/62   Pulse 71   Temp 98.2 F (36.8 C) (Oral)   Ht 6' 0.5 (1.842 m)   Wt 184 lb 6.4 oz (83.6 kg)   SpO2 97%   BMI 24.67 kg/m  BP Readings from Last 3 Encounters:  09/02/24 112/62  04/26/24 136/88  03/15/24 (!) 150/91   Wt Readings from Last 3 Encounters:  09/02/24 184 lb 6.4 oz (83.6 kg)  04/26/24 183 lb 12.8 oz (83.4  kg)  03/15/24 185 lb (83.9 kg)   SpO2 Readings from Last 3 Encounters:  09/02/24 97%  04/26/24 98%  01/27/24 97%      Physical Exam Vitals and nursing note reviewed.  Constitutional:      Appearance: Normal appearance.  HENT:     Right Ear: Tympanic membrane, ear canal and external ear normal.     Left Ear: Tympanic membrane, ear canal and external ear normal.     Mouth/Throat:     Mouth: Mucous membranes are moist.     Pharynx: Oropharynx is clear.  Eyes:     Extraocular Movements: Extraocular movements intact.     Pupils: Pupils are equal, round, and reactive to light.  Cardiovascular:     Rate and Rhythm: Normal rate and regular rhythm.     Pulses: Normal pulses.     Heart sounds: Normal heart sounds.  Pulmonary:     Effort: Pulmonary effort is normal.     Breath sounds: Normal breath sounds.  Abdominal:     General: Bowel sounds are normal. There is no distension.     Palpations: There is no mass.     Tenderness: There is no abdominal tenderness.     Hernia: No hernia is present.  Musculoskeletal:     Right lower leg: No edema.     Left  lower leg: No edema.  Lymphadenopathy:     Cervical: No cervical adenopathy.  Skin:    General: Skin is warm.  Neurological:     General: No focal deficit present.     Mental Status: He is alert.     Deep Tendon Reflexes:     Reflex Scores:      Bicep reflexes are 2+ on the right side and 2+ on the left side.      Patellar reflexes are 2+ on the right side and 2+ on the left side.    Comments: Bilateral upper and lower extremity strength 5/5  Psychiatric:        Mood and Affect: Mood normal.        Behavior: Behavior normal.        Thought Content: Thought content normal.        Judgment: Judgment normal.      No results found for any visits on 09/02/24.    The 10-year ASCVD risk score (Arnett DK, et al., 2019) is: 6.1%    Assessment & Plan:   Problem List Items Addressed This Visit       Cardiovascular and  Mediastinum   Primary hypertension   Currently maintained on amlodipine  5 mg daily.  Blood pressure well-controlled.  Continue medication as prescribed      Relevant Orders   CBC with Differential/Platelet   Comprehensive metabolic panel with GFR   Hemoglobin A1c   TSH   Lipid panel     Other   Preventative health care - Primary   Discussed age-appropriate immunizations and screening exams.  Did review patient's personal, surgical, social, family histories.  Patient is up-to-date on all age-appropriate vaccinations he would like update flu vaccine today.  Get pneumonia and shingles vaccine at local pharmacy or health department.  Patient up-to-date on CRC screening.  PSA today for prostate cancer screening.  Patient was given information at discharge about preventative healthcare maintenance with anticipatory guidance.      Relevant Orders   CBC with Differential/Platelet   Comprehensive metabolic panel with GFR   TSH   Former smoker   Pending urine microscopy rule out microscopic hematuria      Relevant Orders   Urine Microscopic   Other Visit Diagnoses       Screening for lipid disorders       Relevant Orders   Lipid panel     Screening for prostate cancer       Relevant Orders   PSA     Screening for diabetes mellitus       Relevant Orders   Hemoglobin A1c     Encounter for hepatitis C screening test for low risk patient       Relevant Orders   Hepatitis C antibody     Encounter for screening for HIV       Relevant Orders   HIV antibody (with reflex)       Return in about 1 year (around 09/02/2025) for CPE and Labs.    Adina Crandall, NP

## 2024-09-02 NOTE — Assessment & Plan Note (Signed)
 Pending urine microscopy rule out microscopic hematuria

## 2024-09-02 NOTE — Assessment & Plan Note (Signed)
 Currently maintained on amlodipine  5 mg daily.  Blood pressure well-controlled.  Continue medication as prescribed

## 2024-09-02 NOTE — Assessment & Plan Note (Signed)
 Discussed age-appropriate immunizations and screening exams.  Did review patient's personal, surgical, social, family histories.  Patient is up-to-date on all age-appropriate vaccinations he would like update flu vaccine today.  Get pneumonia and shingles vaccine at local pharmacy or health department.  Patient up-to-date on CRC screening.  PSA today for prostate cancer screening.  Patient was given information at discharge about preventative healthcare maintenance with anticipatory guidance.

## 2024-09-02 NOTE — Patient Instructions (Signed)
 Nice to see you today  Get the pneumonia vaccine (prevnar 20) and the shingles vaccine (shingrix) at your local pharmacy or the health department  We did up date your flu vaccine today Follow up with me in 1 year, sooner if you need me

## 2024-09-02 NOTE — Addendum Note (Signed)
 Addended by: SEBASTIAN SHU on: 09/02/2024 02:26 PM   Modules accepted: Orders

## 2024-09-03 LAB — COMPREHENSIVE METABOLIC PANEL WITH GFR
AG Ratio: 1.8 (calc) (ref 1.0–2.5)
ALT: 16 U/L (ref 9–46)
AST: 18 U/L (ref 10–35)
Albumin: 4.3 g/dL (ref 3.6–5.1)
Alkaline phosphatase (APISO): 65 U/L (ref 35–144)
BUN: 14 mg/dL (ref 7–25)
CO2: 26 mmol/L (ref 20–32)
Calcium: 9.1 mg/dL (ref 8.6–10.3)
Chloride: 106 mmol/L (ref 98–110)
Creat: 0.83 mg/dL (ref 0.70–1.35)
Globulin: 2.4 g/dL (ref 1.9–3.7)
Glucose, Bld: 75 mg/dL (ref 65–99)
Potassium: 4.1 mmol/L (ref 3.5–5.3)
Sodium: 141 mmol/L (ref 135–146)
Total Bilirubin: 0.5 mg/dL (ref 0.2–1.2)
Total Protein: 6.7 g/dL (ref 6.1–8.1)
eGFR: 100 mL/min/1.73m2 (ref >=60)

## 2024-09-03 LAB — CBC WITH DIFFERENTIAL/PLATELET
Absolute Lymphocytes: 1193 {cells}/uL (ref 850–3900)
Absolute Monocytes: 261 {cells}/uL (ref 200–950)
Basophils Absolute: 32 {cells}/uL (ref 0–200)
Basophils Relative: 0.7 %
Eosinophils Absolute: 18 {cells}/uL (ref 15–500)
Eosinophils Relative: 0.4 %
HCT: 39.2 % (ref 38.5–50.0)
Hemoglobin: 12.8 g/dL — ABNORMAL LOW (ref 13.2–17.1)
MCH: 30 pg (ref 27.0–33.0)
MCHC: 32.7 g/dL (ref 32.0–36.0)
MCV: 92 fL (ref 80.0–100.0)
MPV: 11.3 fL (ref 7.5–12.5)
Monocytes Relative: 5.8 %
Neutro Abs: 2997 {cells}/uL (ref 1500–7800)
Neutrophils Relative %: 66.6 %
Platelets: 188 Thousand/uL (ref 140–400)
RBC: 4.26 Million/uL (ref 4.20–5.80)
RDW: 12.8 % (ref 11.0–15.0)
Total Lymphocyte: 26.5 %
WBC: 4.5 Thousand/uL (ref 3.8–10.8)

## 2024-09-03 LAB — HEMOGLOBIN A1C
Hgb A1c MFr Bld: 5.6 % (ref <5.7)
Mean Plasma Glucose: 114 mg/dL
eAG (mmol/L): 6.3 mmol/L

## 2024-09-03 LAB — EXTRA URINE SPECIMEN

## 2024-09-03 LAB — URINALYSIS, MICROSCOPIC ONLY
Bacteria, UA: NONE SEEN /HPF
Hyaline Cast: NONE SEEN /LPF
RBC / HPF: NONE SEEN /HPF (ref 0–2)
Squamous Epithelial / HPF: NONE SEEN /HPF (ref <=5)

## 2024-09-03 LAB — HIV ANTIBODY (ROUTINE TESTING W REFLEX)
HIV 1&2 Ab, 4th Generation: NONREACTIVE
HIV FINAL INTERPRETATION: NEGATIVE

## 2024-09-03 LAB — PSA: PSA: 1.53 ng/mL (ref <=4.00)

## 2024-09-03 LAB — LIPID PANEL
Cholesterol: 140 mg/dL (ref <200)
HDL: 42 mg/dL (ref >=40)
LDL Cholesterol (Calc): 76 mg/dL
Non-HDL Cholesterol (Calc): 98 mg/dL (ref <130)
Total CHOL/HDL Ratio: 3.3 (calc) (ref <5.0)
Triglycerides: 134 mg/dL (ref <150)

## 2024-09-03 LAB — TSH: TSH: 1.04 m[IU]/L (ref 0.40–4.50)

## 2024-09-03 LAB — HEPATITIS C ANTIBODY: Hepatitis C Ab: NONREACTIVE

## 2024-09-05 ENCOUNTER — Ambulatory Visit: Payer: Self-pay | Admitting: Nurse Practitioner

## 2024-10-23 ENCOUNTER — Encounter: Payer: Self-pay | Admitting: Emergency Medicine

## 2024-10-23 ENCOUNTER — Emergency Department: Admission: EM | Admit: 2024-10-23 | Discharge: 2024-10-23 | Disposition: A | Discharge status: Home / Self Care

## 2024-10-23 ENCOUNTER — Other Ambulatory Visit: Payer: Self-pay

## 2024-10-23 DIAGNOSIS — Z79899 Other long term (current) drug therapy: Secondary | ICD-10-CM | POA: Diagnosis not present

## 2024-10-23 DIAGNOSIS — I10 Essential (primary) hypertension: Secondary | ICD-10-CM | POA: Diagnosis not present

## 2024-10-23 DIAGNOSIS — B0234 Zoster scleritis: Secondary | ICD-10-CM | POA: Insufficient documentation

## 2024-10-23 MED ORDER — ERYTHROMYCIN 5 MG/GM OP OINT
TOPICAL_OINTMENT | Freq: Once | OPHTHALMIC | Status: AC
  Administered 2024-10-23: 1 via OPHTHALMIC
  Filled 2024-10-23: qty 1

## 2024-10-23 MED ORDER — FLUORESCEIN SODIUM 1 MG OP STRP
1.0000 | ORAL_STRIP | Freq: Once | OPHTHALMIC | Status: AC
  Administered 2024-10-23: 1 via OPHTHALMIC
  Filled 2024-10-23: qty 1

## 2024-10-23 NOTE — ED Notes (Signed)
 No distress, resting comfortably. Gait steady. Mental status at baseline. Understands dc instructions.

## 2024-10-23 NOTE — Discharge Instructions (Signed)
 Give yourself another dose of erythromycin ointment to the right eye right before bed.  Continue to take your Valtrex as prescribed.  Show up at 8 AM to Dr. Everitt office and let them know that your case was discussed overnight in the emergency department and you are to be added onto his morning schedule.  Please return with any acutely worsening symptoms or any other emergency. -- RETURN PRECAUTIONS & AFTERCARE: (ENGLISH) RETURN PRECAUTIONS: Return immediately to the emergency department or see/call your doctor if you feel worse, weak or have changes in speech or vision, are short of breath, have fever, vomiting, pain, bleeding or dark stool, trouble urinating or any new issues. Return here or see/call your doctor if not improving as expected for your suspected condition. FOLLOW-UP CARE: Call your doctor and/or any doctors we referred you to for more advice and to make an appointment. Do this today, tomorrow or after the weekend. Some doctors only take PPO insurance so if you have HMO insurance you may want to contact your HMO or your regular doctor for referral to a specialist within your plan. Either way tell the doctor's office that it was a referral from the emergency department so you get the soonest possible appointment.  YOUR TEST RESULTS: Take result reports of any blood or urine tests, imaging tests and EKG's to your doctor and any referral doctor. Have any abnormal tests repeated. Your doctor or a referral doctor can let you know when this should be done. Also make sure your doctor contacts this hospital to get any test results that are not currently available such as cultures or special tests for infection and final imaging reports, which are often not available at the time you leave the ER but which may list additional important findings that are not documented on the preliminary report. BLOOD PRESSURE: If your blood pressure was greater than 120/80 have your blood pressure rechecked within 1 to  2 weeks. MEDICATION SIDE EFFECTS: Do not drive, walk, bike, take the bus, etc. if you have received or are being prescribed any sedating medications such as those for pain or anxiety or certain antihistamines like Benadryl. If you have been give one of these here get a taxi home or have a friend drive you home. Ask your pharmacist to counsel you on potential side effects of any new medication

## 2024-10-23 NOTE — Progress Notes (Addendum)
 Subjective Patient ID: Joel Ramirez is a 63 y.o. male.    Patient is a 63 year old male who presents to the clinic for evaluation of painful rash on the face and the head that has been going on since past 1 week.  States his symptoms has been worsening since past 3 days.  Denies any fevers or chills.  Describes the rash as burning, painful.  Denies any history of shingles or getting shingles vaccine.  Patient states she is unsure if it started after hair cut.  Denies any vision changes or eye problem.  Reports of no problems with kidneys or liver.   History provided by:  Patient Rash Pertinent negatives include no congestion, cough, diarrhea, eye pain, fever, shortness of breath, sore throat or vomiting.    Review of Systems  Constitutional:  Negative for chills and fever.  HENT:  Negative for congestion, ear pain, sinus pain, sore throat and trouble swallowing.   Eyes:  Negative for pain and itching.  Respiratory:  Negative for cough and shortness of breath.   Cardiovascular:  Negative for chest pain and leg swelling.  Gastrointestinal:  Negative for abdominal pain, diarrhea and vomiting.  Genitourinary:  Negative for dysuria, flank pain, penile swelling and scrotal swelling.  Musculoskeletal:  Negative for gait problem.  Skin:  Positive for rash.  Neurological:  Negative for dizziness and headaches.  Psychiatric/Behavioral:  Negative for agitation.     Patient History  Allergies: Allergies  Allergen Reactions  . Penicillins Rash    LAST HAD AS A CHILD AT AGE 21 Has patient had a PCN reaction causing immediate rash, facial/tongue/throat swelling, SOB or lightheadedness with hypotension: Yes Has patient had a PCN reaction causing severe rash involving mucus membranes or skin necrosis: No Has patient had a PCN reaction that required hospitalization: Unknown Has patient had a PCN reaction occurring within the last 10 years: No If all of the above answers are NO, then may  proceed with Cephalosporin use.     History reviewed. No pertinent past medical history. Past Surgical History:  Procedure Laterality Date  . HERNIA REPAIR     Social History   Socioeconomic History  . Marital status: Single    Spouse name: Not on file  . Number of children: Not on file  . Years of education: Not on file  . Highest education level: Not on file  Occupational History  . Not on file  Tobacco Use  . Smoking status: Never  . Smokeless tobacco: Never  Vaping Use  . Vaping status: Never Used  Substance and Sexual Activity  . Alcohol use: Not Currently  . Drug use: Never  . Sexual activity: Defer  Other Topics Concern  . Not on file  Social History Narrative  . Not on file   History reviewed. No pertinent family history. Current Outpatient Medications on File Prior to Visit  Medication Sig Dispense Refill  . amLODIPine  (Norvasc ) 5 MG tablet Take 5 mg by mouth 1 (one) time each day.    . cyclobenzaprine (Flexeril) 10 MG tablet Take 1 tablet (10 mg total) by mouth 3 (three) times a day if needed for muscle spasms for up to 10 days. 30 tablet 0   No current facility-administered medications on file prior to visit.    Objective  Vitals:   10/23/24 0903  BP: 114/76  BP Location: Right arm  Pulse: 77  Resp: 18  Temp: 37 C (98.6 F)  TempSrc: Oral  SpO2: 98%  Weight: 79.4  kg  Height: 6' 1  PainSc:   7               No results found.  Physical Exam Constitutional:      Appearance: Normal appearance.  HENT:     Head:      Comments: Tender, crusty, rash noted to the Right side of face, inline in dermatome, no discharge.  No eye involvement.  No eyelid involvement.    Nose: Nose normal.     Mouth/Throat:     Mouth: Mucous membranes are moist.     Pharynx: No oropharyngeal exudate or posterior oropharyngeal erythema.  Eyes:     General: Lids are normal. Vision grossly intact.     Extraocular Movements: Extraocular movements intact.      Pupils: Pupils are equal, round, and reactive to light.     Comments: Fluorescein stain: no uptake in the R eye. Vision grossly intact. No dendritic lesions. Perrla eomi  Cardiovascular:     Rate and Rhythm: Normal rate and regular rhythm.  Pulmonary:     Effort: Pulmonary effort is normal.     Breath sounds: Normal breath sounds. No wheezing or rhonchi.  Abdominal:     Palpations: Abdomen is soft.     Tenderness: There is no abdominal tenderness.  Musculoskeletal:        General: No tenderness or deformity. Normal range of motion.     Cervical back: Normal range of motion and neck supple.  Skin:    General: Skin is warm.     Comments:  dermatome on the right side of the face not crossing midline concerning for herpes zoster.  Neurological:     General: No focal deficit present.     Mental Status: He is alert and oriented to person, place, and time.     Sensory: No sensory deficit.     Motor: No weakness.     Coordination: Coordination normal.     Gait: Gait normal.     No results found for this visit on 10/23/24.     Procedures MDM:     1 Stable, acute illness     Explanation of Medical Decision Making and variances from expected care:  H&P as above.  Vital signs are stable.  No acute distress.  Examination concerning for shingles. Denies any vision changes or vision involvement.  No fluorescein uptake in the eye. Patient is neurologically intact Patient will discharge with Valtrex.  Advise close follow with the PCP and eyu doctor.  there is no obvious eye involvement right now   negative fluorescein stain exam noted.  Discussed in detail and complication with concerned with eye involvement, patient will be watching  closely and will go to the ED if needed.  He will also follow with a primary care doctor and ophthalmology.  Strict ED precautions were provided       Assessment requiring historian other than patient: No     Independent visualization of image, tracing, or  test: No     Discussion of management with another provider: No     Risk:: Moderate            Assessment/Plan There are no diagnoses linked to this encounter.      There are no Patient Instructions on file for this visit.  Progress note signed by Gordy Queen, PA on 10/23/24 at  9:08 AM

## 2024-10-23 NOTE — ED Triage Notes (Signed)
 Patient arrives ambulatory by POV c/o shingles onset of about 4 days ago. Went to UC this morning and started on valacyclovir. States he is concerned due to it spreading into his right eye.

## 2024-10-23 NOTE — Progress Notes (Signed)
 Painful rash on face and head for about 1 week

## 2024-10-23 NOTE — ED Provider Notes (Signed)
 [facility] Provider Note    Event Date/Time   First MD Initiated Contact with Patient 10/23/24 1818     (approximate)   History   Herpes Zoster   HPI  Joel Ramirez is a 63 y.o. male with prior history of alcohol use currently in remission, hypertension who presents with 4 days of vesicular lesions overall his forehead nose and scalp and now erythema to the right eye.  Patient states that he developed the lesions 4 days ago and progressively worsened.  He was seen at urgent care this morning and prescribed 7 days of Valtrex.  They did do a fluorescein exam at that time which demonstrated no evidence of keratitis but he was told to follow-up with an ophthalmologist and have a low threshold to come to the emergency department.  He denies any acutely worsening vision changes since that time.  Denies any ocular pain or pain with light however has noticed that his right eye has become erythematous.  He does not will contact lenses.      Physical Exam   Triage Vital Signs: ED Triage Vitals  Encounter Vitals Group     BP 10/23/24 1656 (!) 125/92     Girls Systolic BP Percentile --      Girls Diastolic BP Percentile --      Boys Systolic BP Percentile --      Boys Diastolic BP Percentile --      Pulse Rate 10/23/24 1656 82     Resp 10/23/24 1656 20     Temp 10/23/24 1656 98.5 F (36.9 C)     Temp src --      SpO2 10/23/24 1656 99 %     Weight 10/23/24 1658 185 lb (83.9 kg)     Height 10/23/24 1658 6' 1 (1.854 m)     Head Circumference --      Peak Flow --      Pain Score 10/23/24 1658 8     Pain Loc --      Pain Education --      Exclude from Growth Chart --     Most recent vital signs: Vitals:   10/23/24 1656 10/23/24 1841  BP: (!) 125/92   Pulse: 82   Resp: 20   Temp: 98.5 F (36.9 C)   SpO2: 99% 99%    Nursing Triage Note reviewed. Vital signs reviewed and patients oxygen saturation is normoxic  General: Patient is well nourished,  well developed, awake and alert, resting comfortably in no acute distress Head: Normocephalic and atraumatic Eyes: Normal inspection, extraocular muscles intact, no conjunctival pallor Ear, nose, throat: No vesicular lesions in the canals of the ear bilaterally, TMs within normal limits   Eyes were stained with fluorescein and I do not appreciate any keratitis or uptake however right eye is injected Neck: Normal range of motion Respiratory: Patient is in no respiratory distress, lungs CTAB Cardiovascular: Patient is not tachycardic, RRR without murmur appreciated GI: Abd SNT with no guarding or rebound  Back: Normal inspection of the back with good strength and range of motion throughout all ext Extremities: pulses intact with good cap refills, no LE pitting edema or calf tenderness Neuro: The patient is alert and oriented to person, place, and time, appropriately conversive, with 5/5 bilat UE/LE strength, no gross motor or sensory defects noted. Coordination appears to be adequate. Skin: Warm, dry, and intact Psych: normal mood and affect, no SI or HI  ED Results / Procedures /  Treatments   Labs (all labs ordered are listed, but only abnormal results are displayed) Labs Reviewed - No data to display   EKG None  RADIOLOGY None    PROCEDURES:  Critical Care performed: No  Procedures   MEDICATIONS ORDERED IN ED: Medications  fluorescein ophthalmic strip 1 strip (1 strip Both Eyes Given 10/23/24 1854)  erythromycin ophthalmic ointment (1 Application Right Eye Given 10/23/24 1854)     IMPRESSION / MDM / ASSESSMENT AND PLAN / ED COURSE                                Differential diagnosis includes, but is not limited to, herpes zoster of the eye, herpes keratitis, herpes scleritis   ED course: Patient presents with evidence of herpes zoster in the V1 distribution.  I see no evidence of Ramsay Hunt syndrome.  He has no ocular pain or vision changes.  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  His case was discussed with on-call ophthalmologist and will initiate erythromycin ointment.  He will follow-up in their office tomorrow.  He will return if any acutely worsening symptoms.  All questions answered and patient voiced understanding  He will continue his Valtrex prescription from the urgent care which he has at bedside   Clinical Course as of 10/23/24 1950  Sun Oct 23, 2024  1836 Ophthalmology paged [HD]  [phone] Case discussed with Dr. [place] ophthalmologist on-call) for erythromycin ointment windowsill 1 time before bed (we will send the patient home with the tube).  Patient is to a pill at [facility] at 8 AM tomorrow and be added to his schedule.  Patient understands and is agreement with the plan [HD]    Clinical Course User Index [HD] Nicholaus Rolland BRAVO, MD   At time of discharge there is no evidence of acute life, limb, vision, or fertility threat. Patient has stable vital signs, pain is well controlled, patient is ambulatory and p.o. tolerant.  Discharge instructions were completed using the EPIC system. I would refer you to those at this time. All warnings prescriptions follow-up etc. were discussed in detail with the patient. Patient indicates understanding and is agreeable with this plan. All questions answered.  Patient is made aware that they may return to the emergency department for any worsening or new condition or for any other emergency.   --  The patient has the following acute or chronic illness/injury that poses a possible threat to life or bodily function: [X] : The patient has a potentially serious acute condition or an acute exacerbation of a chronic illness requiring urgent evaluation and management in the Emergency Department. The clinical presentation necessitates immediate consideration of life-threatening or function-threatening diagnoses,  even if they are ultimately ruled out.   FINAL CLINICAL IMPRESSION(S) / ED DIAGNOSES   Final diagnoses:  Zoster scleritis     Rx / DC Orders   ED Discharge Orders     None        Note:  This document was prepared using Dragon voice recognition software and may include unintentional dictation errors.   Nicholaus Rolland BRAVO, MD 10/23/24 1950

## 2024-10-24 ENCOUNTER — Telehealth: Payer: Self-pay

## 2024-10-24 NOTE — Telephone Encounter (Signed)
 Spoke with patient.   Pt was seen by [facility] and was given antibiotics.  States that his eye is still pretty bad and swollen.  States that he will update PCP on how progress with antibiotics.  No further questions or concerns.

## 2024-10-24 NOTE — Telephone Encounter (Signed)
 Noted. He needs to continue following with eye doctor as recommended

## 2024-10-24 NOTE — Telephone Encounter (Signed)
 Copied from CRM [id number]. Topic: Clinical - Medical Advice >> Oct 24, 2024 10:22 AM Hadassah PARAS wrote: Reason for CRM: Pt was diagnosed with shingles this past weekend in the ED. Pt would like PCP to know this information. He is currently taking medication. Did not want to schedule hospital fu. Please advise #[phone] as pt would like to know if PCP would like to see him or any recommendations

## 2024-10-24 NOTE — Telephone Encounter (Signed)
 I see the ED visit and photo. He was suppose to be evaluated at [facility]. Has he done that?

## 2024-11-14 ENCOUNTER — Ambulatory Visit: Payer: Self-pay

## 2024-11-14 ENCOUNTER — Other Ambulatory Visit: Payer: Self-pay | Admitting: Nurse Practitioner

## 2024-11-14 DIAGNOSIS — B0229 Other postherpetic nervous system involvement: Secondary | ICD-10-CM

## 2024-11-14 MED ORDER — GABAPENTIN 100 MG PO CAPS: 100.0000 mg | ORAL_CAPSULE | Freq: Three times a day (TID) | ORAL | 0 refills | Status: DC

## 2024-11-14 NOTE — Telephone Encounter (Signed)
 Will send in some gabapentin . He can take 1 capsule three times a day. This can make him sleepy so use caution

## 2024-11-14 NOTE — Telephone Encounter (Signed)
 Looks like patient was seen in ED not our office.

## 2024-11-14 NOTE — Telephone Encounter (Signed)
 Called pt and relayed information.  Pt verbalized understanding and states he will pick up at [facility].  No further questions or concerns.

## 2024-11-14 NOTE — Telephone Encounter (Signed)
 FYI Only or Action Required?: Action required by provider: update on patient condition and nerve pain medication request.  Patient was last seen in primary care on 09/02/2024 by Wendee Lynwood HERO, NP.  Called Nurse Triage reporting Herpes Zoster.  Symptoms began several weeks ago.  Interventions attempted: OTC medications: Tylenol .  Symptoms are: unchanged.  Triage Disposition: See HCP Within 4 Hours (Or PCP Triage)  Patient/caregiver understands and will follow disposition?: No, wishes to speak with PCP    Copied from CRM #[id number]. Topic: Clinical - Red Word Triage >> Nov 14, 2024  1:02 PM Eva FALCON wrote: Red Word that prompted transfer to Nurse Triage: recovering from shingles,  having pain on middle of head, around right eye. been dealing with this for about 2 weeks. Reason for Disposition  [1] Shingles rash of face AND [2] eye pain or blurred vision    Already evaluated  Answer Assessment - Initial Assessment Questions Additional info: Shingles diagnosed at ED on 10/23/24.  Overall shingles rash has improved, still having nerve pain. Tylenol  ineffective. Requesting medication to help with nerve pain, he suggests gabapentin  or something like that. Requesting call back if unable to send medication.  Declines follow up visit.    1. APPEARANCE of RASH: What does the rash look like?      Much improved Red patches, scabs, not draining.  2. LOCATION: Where is the rash located?      Forehead, between eyes, scalp 3. ONSET: When did the rash start?      Few weeks ago 4. ITCHING: Does the rash itch? If Yes, ask: How bad is the itch?  (Scale 1-10; or mild, moderate, severe)      5. PAIN: Does the rash hurt? If Yes, ask: How bad is the pain?  (Scale 0-10; or none, mild, moderate, severe)     Yes, feels sharp and some tingling 6. OTHER SYMPTOMS: Do you have any other symptoms? (e.g., fever)     Eye swelling has resolved, still having vision changes, evaluated by  ophthalmology-on eye drops.  7. PREGNANCY: Is there any chance you are pregnant? When was your last menstrual period?  Protocols used: Shingles (Zoster)-A-AH

## 2024-12-09 ENCOUNTER — Other Ambulatory Visit: Payer: Self-pay | Admitting: Nurse Practitioner

## 2024-12-09 DIAGNOSIS — B0229 Other postherpetic nervous system involvement: Secondary | ICD-10-CM

## 2024-12-10 ENCOUNTER — Other Ambulatory Visit: Payer: Self-pay | Admitting: Nurse Practitioner

## 2024-12-10 DIAGNOSIS — I10 Essential (primary) hypertension: Secondary | ICD-10-CM

## 2024-12-12 NOTE — Telephone Encounter (Signed)
 Cpe was in September has next Cpe scheduled.

## 2024-12-20 ENCOUNTER — Ambulatory Visit: Payer: Self-pay | Admitting: Nurse Practitioner

## 2024-12-20 DIAGNOSIS — B0229 Other postherpetic nervous system involvement: Secondary | ICD-10-CM

## 2024-12-20 MED ORDER — GABAPENTIN 100 MG PO CAPS: 100.0000 mg | ORAL_CAPSULE | Freq: Three times a day (TID) | ORAL | 2 refills | Status: AC

## 2024-12-20 NOTE — Telephone Encounter (Signed)
 FYI Only or Action Required?: Action required by provider: medication refill request. Gabapentin  to [facility] on [address] in [place]  Patient was last seen in primary care on 09/02/2024 by Joel Lynwood HERO, NP.  Called Nurse Triage reporting Herpes Zoster.  Symptoms began several months ago.  Interventions attempted: Prescription medications: gabapentin .  Symptoms are: gradually improving.  Triage Disposition: Home Care  Patient/caregiver understands and will follow disposition?: Yes    Copied from CRM [id number]. Topic: Clinical - Red Word Triage >> Dec 20, 2024 12:05 PM Joel Ramirez wrote: Red Word that prompted transfer to Nurse Triage: patient has shingles and still having pain between eyes. Requesting refill gabapentin . Reason for Disposition  Postherpetic neuralgia, questions about    Requesting refill of gabapentin  for 6-7/10 pain from shingles lesion that has been cleared for 2-3 weeks  Answer Assessment - Initial Assessment Questions This RN recommended pt be examined if pain so significant, pt declines at this time, requesting to continue gabapentin , needs sent to [facility] on [address] in [place]. Advised call back if new or worsening symptoms.   Right between eyes at top of nose, had a big spot of shingles right there, pain still there Gabapentin  helps not a whole lot but probably better than nothing 6-7/10 pain off and on, not constant, kind of random More aggravating than debilitating Everything's cleared up, no lesions, skin is looking back to normal but still having the pain there No pain to eyes or vision changes, eye doc said good Been gone 3 weeks maybe 2 weeks Wondering if could continue gabapentin  Preferred pharmacy [facility] on [address]  Protocols used: Shingles (Zoster)-A-AH

## 2025-08-10 ENCOUNTER — Ambulatory Visit: Admitting: Dermatology

## 2025-09-04 ENCOUNTER — Encounter: Admitting: Nurse Practitioner
